# Patient Record
Sex: Male | Born: 1955
Health system: Southern US, Community
[De-identification: ages and names within clinical notes are randomized; demographics above are authoritative.]

## PROBLEM LIST (undated history)

## (undated) DIAGNOSIS — K5790 Diverticulosis of intestine, part unspecified, without perforation or abscess without bleeding: Secondary | ICD-10-CM

## (undated) DIAGNOSIS — E7489 Other specified disorders of carbohydrate metabolism: Secondary | ICD-10-CM

## (undated) DIAGNOSIS — F988 Other specified behavioral and emotional disorders with onset usually occurring in childhood and adolescence: Secondary | ICD-10-CM

## (undated) DIAGNOSIS — M5030 Other cervical disc degeneration, unspecified cervical region: Secondary | ICD-10-CM

## (undated) DIAGNOSIS — T8859XA Other complications of anesthesia, initial encounter: Secondary | ICD-10-CM

## (undated) DIAGNOSIS — M797 Fibromyalgia: Secondary | ICD-10-CM

## (undated) DIAGNOSIS — J189 Pneumonia, unspecified organism: Secondary | ICD-10-CM

## (undated) DIAGNOSIS — E748 Other specified disorders of carbohydrate metabolism: Secondary | ICD-10-CM

## (undated) HISTORY — PX: LUMBAR DISC SURGERY: SHX700

## (undated) HISTORY — DX: Diverticulosis of intestine, part unspecified, without perforation or abscess without bleeding: K57.90

## (undated) HISTORY — DX: Other specified disorders of carbohydrate metabolism: E74.89

## (undated) HISTORY — PX: LAMINECTOMY: SHX219

## (undated) HISTORY — DX: Other specified behavioral and emotional disorders with onset usually occurring in childhood and adolescence: F98.8

## (undated) HISTORY — PX: NASAL SINUS SURGERY: SHX719

## (undated) HISTORY — DX: Other cervical disc degeneration, unspecified cervical region: M50.30

## (undated) HISTORY — DX: Other specified disorders of carbohydrate metabolism: E74.8

## (undated) HISTORY — PX: OTHER SURGICAL HISTORY: SHX169

## (undated) HISTORY — DX: Fibromyalgia: M79.7

---

## 1997-09-10 ENCOUNTER — Emergency Department (HOSPITAL_COMMUNITY): Admission: EM | Admit: 1997-09-10 | Discharge: 1997-09-10 | Payer: Self-pay | Admitting: Emergency Medicine

## 1997-09-11 ENCOUNTER — Ambulatory Visit (HOSPITAL_COMMUNITY): Admission: RE | Admit: 1997-09-11 | Discharge: 1997-09-11 | Payer: Self-pay | Admitting: General Surgery

## 1999-01-21 ENCOUNTER — Encounter: Admission: RE | Admit: 1999-01-21 | Discharge: 1999-01-21 | Payer: Self-pay | Admitting: Specialist

## 1999-01-21 ENCOUNTER — Encounter: Payer: Self-pay | Admitting: Specialist

## 2002-04-06 DIAGNOSIS — M5030 Other cervical disc degeneration, unspecified cervical region: Secondary | ICD-10-CM

## 2002-04-06 HISTORY — DX: Other cervical disc degeneration, unspecified cervical region: M50.30

## 2002-04-14 ENCOUNTER — Encounter: Payer: Self-pay | Admitting: Family Medicine

## 2002-04-14 ENCOUNTER — Encounter: Admission: RE | Admit: 2002-04-14 | Discharge: 2002-04-14 | Payer: Self-pay | Admitting: Family Medicine

## 2005-11-01 ENCOUNTER — Ambulatory Visit: Payer: Self-pay | Admitting: Family Medicine

## 2005-12-27 ENCOUNTER — Encounter: Admission: RE | Admit: 2005-12-27 | Discharge: 2005-12-27 | Payer: Self-pay | Admitting: General Surgery

## 2007-01-29 ENCOUNTER — Ambulatory Visit: Payer: Self-pay | Admitting: Family Medicine

## 2007-02-20 LAB — HM COLONOSCOPY

## 2007-04-02 ENCOUNTER — Ambulatory Visit: Payer: Self-pay | Admitting: Family Medicine

## 2007-05-08 ENCOUNTER — Ambulatory Visit: Payer: Self-pay | Admitting: Family Medicine

## 2007-10-29 ENCOUNTER — Ambulatory Visit: Payer: Self-pay | Admitting: Family Medicine

## 2008-01-04 ENCOUNTER — Encounter: Admission: RE | Admit: 2008-01-04 | Discharge: 2008-01-04 | Payer: Self-pay | Admitting: Sports Medicine

## 2008-01-15 ENCOUNTER — Ambulatory Visit: Payer: Self-pay | Admitting: Family Medicine

## 2008-01-15 ENCOUNTER — Encounter: Admission: RE | Admit: 2008-01-15 | Discharge: 2008-01-15 | Payer: Self-pay | Admitting: Family Medicine

## 2008-03-19 ENCOUNTER — Ambulatory Visit (HOSPITAL_BASED_OUTPATIENT_CLINIC_OR_DEPARTMENT_OTHER): Admission: RE | Admit: 2008-03-19 | Discharge: 2008-03-19 | Payer: Self-pay | Admitting: Orthopedic Surgery

## 2008-04-15 ENCOUNTER — Ambulatory Visit: Payer: Self-pay | Admitting: Family Medicine

## 2008-06-16 ENCOUNTER — Ambulatory Visit: Payer: Self-pay | Admitting: Family Medicine

## 2008-10-27 ENCOUNTER — Ambulatory Visit: Payer: Self-pay | Admitting: Family Medicine

## 2009-05-28 ENCOUNTER — Ambulatory Visit: Payer: Self-pay | Admitting: Family Medicine

## 2009-07-15 ENCOUNTER — Ambulatory Visit: Payer: Self-pay | Admitting: Family Medicine

## 2009-12-07 ENCOUNTER — Ambulatory Visit: Payer: Self-pay | Admitting: Family Medicine

## 2010-02-16 ENCOUNTER — Ambulatory Visit: Payer: Self-pay | Admitting: Family Medicine

## 2010-02-24 ENCOUNTER — Ambulatory Visit: Payer: Self-pay | Admitting: Family Medicine

## 2010-03-27 ENCOUNTER — Encounter: Payer: Self-pay | Admitting: Sports Medicine

## 2010-04-12 ENCOUNTER — Ambulatory Visit: Payer: Self-pay | Admitting: Physician Assistant

## 2010-04-13 ENCOUNTER — Ambulatory Visit (INDEPENDENT_AMBULATORY_CARE_PROVIDER_SITE_OTHER): Payer: BC Managed Care – PPO | Admitting: Family Medicine

## 2010-04-13 DIAGNOSIS — M25519 Pain in unspecified shoulder: Secondary | ICD-10-CM

## 2010-04-13 DIAGNOSIS — J309 Allergic rhinitis, unspecified: Secondary | ICD-10-CM

## 2010-04-13 DIAGNOSIS — M76899 Other specified enthesopathies of unspecified lower limb, excluding foot: Secondary | ICD-10-CM

## 2010-06-02 ENCOUNTER — Ambulatory Visit (INDEPENDENT_AMBULATORY_CARE_PROVIDER_SITE_OTHER): Payer: BC Managed Care – PPO | Admitting: Family Medicine

## 2010-06-02 DIAGNOSIS — Z79899 Other long term (current) drug therapy: Secondary | ICD-10-CM

## 2010-06-02 DIAGNOSIS — B353 Tinea pedis: Secondary | ICD-10-CM

## 2010-06-02 DIAGNOSIS — F988 Other specified behavioral and emotional disorders with onset usually occurring in childhood and adolescence: Secondary | ICD-10-CM

## 2010-06-02 DIAGNOSIS — J209 Acute bronchitis, unspecified: Secondary | ICD-10-CM

## 2010-06-20 LAB — POCT HEMOGLOBIN-HEMACUE: Hemoglobin: 15.1 g/dL (ref 13.0–17.0)

## 2010-07-19 NOTE — Op Note (Signed)
NAMEDAVIS, AMBROSINI NO.:  [id number]   MEDICAL RECORD NO.:  [id number]          PATIENT TYPE:  AMB   LOCATION:  DSC                          FACILITY:  [facility]   PHYSICIAN:  Loreta Ave, M.D. DATE OF BIRTH:  November 13, 1955   DATE OF PROCEDURE:  03/19/2008  DATE OF DISCHARGE:                               OPERATIVE REPORT   PREOPERATIVE DIAGNOSIS:  Right shoulder impingement, distal clavicle  osteolysis and tendinopathy, rotator cuff.   POSTOPERATIVE DIAGNOSES:  Right shoulder impingement, distal clavicle  osteolysis and tendinopathy, rotator cuff with also complex tearing of  anterosuperior labrum.  Partial tearing, undersurface of the cuff.  No  full-thickness tears.   PROCEDURE:  Right shoulder exam under anesthesia, arthroscopy,  debridement of labrum and the rotator cuff.  Bursectomy, acromioplasty,  coracoacromial ligament release.  Excision, distal clavicle.   SURGEON:  Loreta Ave, MD   ANESTHESIA:  General.   BLOOD LOSS:  Minimal.   SPECIMENS:  None.   CULTURES:  None.   COMPLICATIONS:  None.   DRESSING:  Soft compressive with sling.   PROCEDURE:  The patient was brought to the operating room and placed on  the operating table in supine position.  After adequate anesthesia had  been obtained, shoulder examined.  Full motion stable shoulder.  Placed  in a beach-chair position on the shoulder positioner and prepped and  draped in the usual sterile fashion.  Three portals, one each in  anterior, posterior, and lateral.  Shoulder entered with blunt  obturator.  Arthroscope introduced into the shoulder distended and  inspected.  Complex tearing of superior labrum debrided.  Biceps tendon,  biceps anchor, and articular cartilage all otherwise looked intact.  A  little partial tearing in the crescent region of the cuff debrided.  Cable well anchored.  Infraspinatus looked good.  Cannula re-directed  subacromially.  Type II to III acromion,  marked reactive bursitis,  abrasive changes on the top of the cuff, supraspinatus anteriorly.  Bursa resected and cuff debrided.  Acromioplasty to a type 1 acromion  releasing CA ligament with cautery.  Distal clavicle grade 4 changes,  spurs, osteolysis, inflammatory debris.  Periarticular spurs, debris in  the joint and the lateral centimeter of clavicle resected.  Adequacy of decompression and clavicle excision confirmed viewing from  all portals.  Instruments and fluid removed.  Portals, shoulder, bursa  injected with Marcaine.  Portals were closed with 4-0 nylon.  Sterile  compressive dressing applied.  Anesthesia reversed.  Brought to the  recovery room.  Tolerated surgery well.  No complications.      Loreta Ave, M.D.  Electronically Signed     DFM/MEDQ  D:  03/19/2008  T:  03/20/2008  Job:  [id number]

## 2010-12-05 ENCOUNTER — Encounter: Payer: Self-pay | Admitting: Family Medicine

## 2010-12-05 ENCOUNTER — Ambulatory Visit (INDEPENDENT_AMBULATORY_CARE_PROVIDER_SITE_OTHER): Payer: BC Managed Care – PPO | Admitting: Medical

## 2010-12-05 ENCOUNTER — Encounter: Payer: Self-pay | Admitting: Medical

## 2010-12-05 ENCOUNTER — Telehealth: Payer: Self-pay | Admitting: Medical

## 2010-12-05 VITALS — BP 130/90 | HR 72 | Temp 98.2°F | Resp 16 | Ht 72.0 in | Wt 182.0 lb

## 2010-12-05 DIAGNOSIS — Z79899 Other long term (current) drug therapy: Secondary | ICD-10-CM

## 2010-12-05 DIAGNOSIS — R0989 Other specified symptoms and signs involving the circulatory and respiratory systems: Secondary | ICD-10-CM

## 2010-12-05 DIAGNOSIS — G4700 Insomnia, unspecified: Secondary | ICD-10-CM

## 2010-12-05 DIAGNOSIS — R0609 Other forms of dyspnea: Secondary | ICD-10-CM

## 2010-12-05 DIAGNOSIS — R030 Elevated blood-pressure reading, without diagnosis of hypertension: Secondary | ICD-10-CM

## 2010-12-05 DIAGNOSIS — F988 Other specified behavioral and emotional disorders with onset usually occurring in childhood and adolescence: Secondary | ICD-10-CM | POA: Insufficient documentation

## 2010-12-05 DIAGNOSIS — R011 Cardiac murmur, unspecified: Secondary | ICD-10-CM

## 2010-12-05 LAB — BASIC METABOLIC PANEL
Chloride: 102 mEq/L (ref 96–112)
Glucose, Bld: 86 mg/dL (ref 70–99)
Potassium: 4.3 mEq/L (ref 3.5–5.3)
Sodium: 139 mEq/L (ref 135–145)

## 2010-12-05 LAB — TSH: TSH: 1.803 u[IU]/mL (ref 0.350–4.500)

## 2010-12-05 LAB — CBC
Hemoglobin: 13.3 g/dL (ref 13.0–17.0)
MCHC: 32.4 g/dL (ref 30.0–36.0)
RBC: 4.49 MIL/uL (ref 4.22–5.81)
WBC: 5.3 10*3/uL (ref 4.0–10.5)

## 2010-12-05 MED ORDER — AMPHETAMINE-DEXTROAMPHET ER 30 MG PO CP24: 30.0000 mg | ORAL_CAPSULE | ORAL | Status: DC

## 2010-12-05 MED ORDER — ESZOPICLONE 2 MG PO TABS: 2.0000 mg | ORAL_TABLET | Freq: Every day | ORAL | Status: DC

## 2010-12-05 NOTE — Patient Instructions (Signed)
Follow up as planned for cardiology evaluation.

## 2010-12-05 NOTE — Progress Notes (Signed)
Subjective:   HPI  Nicholas [name] is a 55 y.o. male who presents for multiple c/o.  He notes trouble breathing.  He travels often, is in different climates regularly.  Last week he was in [place], few weeks ago was in [place].  He travels all the time.  He will be gone the next month as well.  He notes that he use to not have allergy problems until a few years ago was exposed to cotton dust in [place].  Since then, he has had allergy problems.    For the last few months he notes that he stays congested, and at times feels short of breath.  He notes that when he is active, such as running down a field, he gets difficulty breathing with exercise.  Can only go about 200 yards before being out of breath.  Usually doesn't have problems breathing at rest.  He denies hx/o heart or lung problems.  He is a lifetime nonsmoker.  He has no significant family hx/o heart disease.  He is using Zyrtec and Astepro nasal spray regularly but this hasn't helped his breathing. He notes hx/o 5 nasal surgeries, stays stopped up in nose all the time.   He notes sleeping problems.  Because of his travel schedule, living in hotel, sharing rooms with other coworkers, he rarely sleeps well.  He is a very light sleeper, says he can "hear an ant pissing on cotton".  He has trouble getting and staying asleep.   Lately he has been drinking several glasses of alcohol nightly just to get to sleep.   When he is sharing a hotel room, he seldom gets more than 3-4 hours of sleep due to the other person snoring, or due to there smoking or hygiene issues.  He has tried various things in the past for sleep - Ambien, Melatonin, Benadryl, and none help.  Benadryl makes him not sleep for days.    No other aggravating or relieving factors.  No other c/o.  The following portions of the patient's history were reviewed and updated as appropriate: allergies, current medications, past family history, past medical history, past  social history, past surgical history and problem list.  Past Medical History  Diagnosis Date  . DDD (degenerative disc disease), cervical 04/2002  . ADD (attention deficit disorder)   . Pancreatic alpha-amylase deficiency   . Fibromyalgia   . Hemorrhoids   . Diverticulosis    History reviewed. No pertinent past surgical history. No Known Allergies Current outpatient prescriptions:amphetamine-dextroamphetamine (ADDERALL XR) 30 MG 24 hr capsule, Take 30 mg by mouth every morning.  , Disp: , Rfl: ;  Multiple Vitamins-Minerals (MULTIVITAMIN WITH MINERALS) tablet, Take 1 tablet by mouth daily.  , Disp: , Rfl: ;  naproxen (NAPRELAN) 500 MG 24 hr tablet, Take 500 mg by mouth daily with breakfast.  , Disp: , Rfl:  amphetamine-dextroamphetamine (ADDERALL XR, 30MG ,) 30 MG 24 hr capsule, Take 1 capsule (30 mg total) by mouth every morning., Disp: 30 capsule, Rfl: 0;  eszopiclone (LUNESTA) 2 MG TABS, Take 1 tablet (2 mg total) by mouth at bedtime. Take immediately before bedtime, Disp: 7 tablet, Rfl: 1   Review of Systems Constitutional: denies fever, chills, sweats, unexpected weight change, anorexia, fatigue Allergy: +congestion, sneezing Dermatology: denies rash ENT: +runny nose; no ear pain, sore throat, hoarseness, sinus pain Cardiology: denies chest pain, palpitations, edema Respiratory: +DOE; denies cough, wheezing Gastroenterology: denies abdominal pain, nausea, vomiting, diarrhea, constipation Hematology: denies bleeding or bruising  problems Musculoskeletal: denies arthralgias, myalgias, joint swelling, back pain Urology: denies dysuria, difficulty urinating Neurology: no headache, weakness, tingling, numbness Psychology: sleep problem; denies depressed mood, agitation,      Objective:   Physical Exam  General appearance: alert, no distress, WD/WN, lean white male HEENT: normocephalic, sclerae anicteric, PERRLA, EOMi, nares with swollen turbinates, but no discharge or erythema,  pharynx normal Oral cavity: MMM, no lesions Neck: supple, no lymphadenopathy, no thyromegaly, no masses Heart: RRR, normal S1, S2, no murmurs Lungs: CTA bilaterally, no wheezes, rhonchi, or rales Abdomen: +bs, soft, non tender, non distended, no masses, no hepatomegaly, no splenomegaly Extremities: no edema, no cyanosis, no clubbing Pulses: 2+ symmetric, upper and lower extremities, normal cap refill Neurological: alert, oriented x 3, CN2-12 intact   Assessment :    Encounter Diagnoses  Name Primary?  Marland Kitchen Dyspnea on exertion Yes  . Heart murmur   . Elevated blood pressure reading without diagnosis of hypertension   . Encounter for long-term (current) use of other medications   . Insomnia   . ADD (attention deficit disorder)      Plan:   CXR today unremarkable, no acute changes.  We will send CXR for over read.   EKG today nsr, rate 75bpm, normal intervals, axis 20 degrees, risk factors male and age 10yo, normal EKG, no prior to compare.   Given his symptoms of DOE, elevated BP, and new heart murmur on exam today, I will refer to cardiology for evaluation to hopefully rule out cardiac source.   Labs today to help eval.  Insomnia - discussed sleep hygiene.  He has failed OTC Melatonin, has failed Ambien, and we will use trial of Lunesta 2mg  samples.  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   ADD - no c/t, doing well on Adderrall XR 30mg  daily.  Refilled medication today.

## 2010-12-06 NOTE — Telephone Encounter (Signed)
DONE

## 2010-12-08 ENCOUNTER — Telehealth: Payer: Self-pay | Admitting: Family Medicine

## 2010-12-12 ENCOUNTER — Other Ambulatory Visit: Payer: Self-pay | Admitting: Medical

## 2010-12-12 MED ORDER — TRAZODONE HCL 50 MG PO TABS: 50.0000 mg | ORAL_TABLET | Freq: Every day | ORAL | Status: AC

## 2010-12-21 NOTE — Telephone Encounter (Signed)
dt ?

## 2011-02-22 ENCOUNTER — Ambulatory Visit: Payer: BC Managed Care – PPO | Admitting: Family Medicine

## 2011-02-23 ENCOUNTER — Encounter: Payer: Self-pay | Admitting: Family Medicine

## 2011-02-23 ENCOUNTER — Ambulatory Visit (INDEPENDENT_AMBULATORY_CARE_PROVIDER_SITE_OTHER): Payer: BC Managed Care – PPO | Admitting: Family Medicine

## 2011-02-23 ENCOUNTER — Telehealth: Payer: Self-pay | Admitting: Family Medicine

## 2011-02-23 VITALS — BP 118/72 | HR 72 | Temp 98.3°F | Wt 184.0 lb

## 2011-02-23 DIAGNOSIS — J019 Acute sinusitis, unspecified: Secondary | ICD-10-CM

## 2011-02-23 MED ORDER — HYDROCOD POLST-CHLORPHEN POLST 10-8 MG/5ML PO LQCR: 5.0000 mL | Freq: Two times a day (BID) | ORAL | Status: DC | PRN

## 2011-02-23 MED ORDER — AMOXICILLIN 875 MG PO TABS: 875.0000 mg | ORAL_TABLET | Freq: Two times a day (BID) | ORAL | Status: AC

## 2011-02-23 NOTE — Telephone Encounter (Signed)
TSD  

## 2011-02-23 NOTE — Progress Notes (Signed)
Subjective:    Patient ID: Nicholas [name], male    DOB: 09/30/1955, 55 y.o.   MRN: [id number]  HPI He complains of a five-day history of cough, sinus congestion, left maxillary discomfort with upper tooth pain as well as PND. No fever or chills, sore throat or earache. He does not smoke and has no underlying allergies.   Review of Systems     Objective:   Physical Exam alert and in no distress. Tympanic membranes and canals are normal. Throat is clear. Tonsils are normal. Neck is supple without adenopathy or thyromegaly. Cardiac exam shows a regular sinus rhythm without murmurs or gallops. Lungs are clear to auscultation. Nasal mucosa is slightly red with some tenderness over her left maxillary sinus.       Assessment & Plan:   1. Sinusitis acute    I will treat with Amoxil. He is to call not entirely better.

## 2011-04-25 ENCOUNTER — Encounter: Payer: Self-pay | Admitting: Family Medicine

## 2011-04-25 ENCOUNTER — Ambulatory Visit (INDEPENDENT_AMBULATORY_CARE_PROVIDER_SITE_OTHER): Payer: BC Managed Care – PPO | Admitting: Family Medicine

## 2011-04-25 VITALS — BP 120/70 | HR 76 | Wt 187.0 lb

## 2011-04-25 DIAGNOSIS — J019 Acute sinusitis, unspecified: Secondary | ICD-10-CM

## 2011-04-25 DIAGNOSIS — R6882 Decreased libido: Secondary | ICD-10-CM

## 2011-04-25 DIAGNOSIS — F988 Other specified behavioral and emotional disorders with onset usually occurring in childhood and adolescence: Secondary | ICD-10-CM

## 2011-04-25 MED ORDER — AMPHETAMINE-DEXTROAMPHET ER 30 MG PO CP24: 30.0000 mg | ORAL_CAPSULE | ORAL | Status: DC

## 2011-04-25 MED ORDER — AMOXICILLIN-POT CLAVULANATE 875-125 MG PO TABS: 1.0000 | ORAL_TABLET | Freq: Two times a day (BID) | ORAL | Status: AC

## 2011-04-25 NOTE — Patient Instructions (Signed)
Take all the antibiotic and call me if he not totally back to normal

## 2011-04-25 NOTE — Progress Notes (Signed)
Subjective:    Patient ID: Nicholas [name], male    DOB: 04/01/1955, 56 y.o.   MRN: [id number]  HPI He states that since his last visit he has had intermittent difficulty with nasal congestion, malaise, postnasal drainage and coughing. The antibiotic did get him better but not over it entirely. He continues to do well on his Adderall. He uses this sparingly and not on any regular basis. He'll some complains of a several month history of decreased energy, stamina as well as libido.   Review of Systems     Objective:   Physical Exam alert and in no distress. Tympanic membranes and canals are normal. Throat is clear. Tonsils are normal. Neck is supple without adenopathy or thyromegaly. Cardiac exam shows a regular sinus rhythm without murmurs or gallops. Lungs are clear to auscultation. Nasal mucosa is red with slight tenderness over ethmoid sinuses        Assessment & Plan:   1. Libido, decreased  Testosterone  2. Sinusitis, acute    3. ADD (attention deficit disorder)     his Adderall will be renewed. We'll also switch him to Augmentin. He is to call if continued difficulty.

## 2011-05-05 ENCOUNTER — Telehealth: Payer: Self-pay

## 2011-05-05 ENCOUNTER — Other Ambulatory Visit: Payer: Self-pay

## 2011-05-05 DIAGNOSIS — R0602 Shortness of breath: Secondary | ICD-10-CM

## 2011-05-05 NOTE — Telephone Encounter (Signed)
Pt called today and asked if you would please call him back he is so tired and lazy and doesn't understand

## 2011-05-05 NOTE — Telephone Encounter (Signed)
Need PFT  

## 2011-05-05 NOTE — Telephone Encounter (Signed)
PT HAS APPT MARCH 12 @ 9 AM AT [facility]

## 2011-05-16 ENCOUNTER — Ambulatory Visit (HOSPITAL_COMMUNITY)
Admission: RE | Admit: 2011-05-16 | Discharge: 2011-05-16 | Disposition: A | Discharge status: Home / Self Care | Payer: BC Managed Care – PPO | Source: Ambulatory Visit | Attending: Family Medicine | Admitting: Family Medicine

## 2011-05-16 DIAGNOSIS — R0602 Shortness of breath: Secondary | ICD-10-CM

## 2011-05-16 LAB — PULMONARY FUNCTION TEST

## 2011-05-16 MED ORDER — ALBUTEROL SULFATE (5 MG/ML) 0.5% IN NEBU
2.5000 mg | INHALATION_SOLUTION | Freq: Once | RESPIRATORY_TRACT | Status: AC
  Administered 2011-05-16: 2.5 mg via RESPIRATORY_TRACT

## 2011-05-31 ENCOUNTER — Telehealth: Payer: Self-pay

## 2011-05-31 NOTE — Telephone Encounter (Signed)
Pt would like to get raspatory test results we sent him to have 3/12 at [facility] please advise

## 2011-05-31 NOTE — Telephone Encounter (Signed)
I do not see the final report. Have them fax me a copy. Let him know that they have not sent a report

## 2011-06-01 NOTE — Telephone Encounter (Signed)
Nicholas [name] please see if you can locate the report

## 2011-06-01 NOTE — Telephone Encounter (Signed)
Pt informed we are in process of getting his report

## 2011-06-01 NOTE — Telephone Encounter (Signed)
[facility] is faxing over results

## 2011-06-06 ENCOUNTER — Telehealth: Payer: Self-pay | Admitting: Internal Medicine

## 2011-06-06 MED ORDER — ALBUTEROL SULFATE HFA 108 (90 BASE) MCG/ACT IN AERS: 2.0000 | INHALATION_SPRAY | Freq: Four times a day (QID) | RESPIRATORY_TRACT | Status: DC | PRN

## 2011-06-06 NOTE — Telephone Encounter (Signed)
His pulmonary function testing came out normal. He still is having difficulty with coughing and states that it usually occurs every year around this time. In spite of the PFTs being normal with a bronchodilator, I will give him Proventil. He will call me tomorrow and I know how he is doing.

## 2011-06-07 ENCOUNTER — Telehealth: Payer: Self-pay | Admitting: Family Medicine

## 2011-06-07 NOTE — Telephone Encounter (Signed)
Schedule him an appointment with pulmonary. You can call in Tussionex but tell him it can make him drowsy and try to use it only at night

## 2011-06-08 ENCOUNTER — Other Ambulatory Visit: Payer: Self-pay

## 2011-06-08 MED ORDER — HYDROCODONE-HOMATROPINE 5-1.5 MG/5ML PO SYRP: 5.0000 mL | ORAL_SOLUTION | Freq: Four times a day (QID) | ORAL | Status: DC | PRN

## 2011-06-08 NOTE — Telephone Encounter (Signed)
Called cough med in per jcl 

## 2011-07-10 ENCOUNTER — Ambulatory Visit (INDEPENDENT_AMBULATORY_CARE_PROVIDER_SITE_OTHER): Payer: BC Managed Care – PPO | Admitting: Pulmonary Disease

## 2011-07-10 ENCOUNTER — Encounter: Payer: Self-pay | Admitting: Pulmonary Disease

## 2011-07-10 ENCOUNTER — Ambulatory Visit (INDEPENDENT_AMBULATORY_CARE_PROVIDER_SITE_OTHER)
Admission: RE | Admit: 2011-07-10 | Discharge: 2011-07-10 | Disposition: A | Discharge status: Home / Self Care | Payer: BC Managed Care – PPO | Source: Ambulatory Visit | Attending: Pulmonary Disease | Admitting: Pulmonary Disease

## 2011-07-10 VITALS — BP 136/84 | HR 76 | Temp 98.3°F | Ht 72.0 in | Wt 184.6 lb

## 2011-07-10 DIAGNOSIS — R059 Cough, unspecified: Secondary | ICD-10-CM

## 2011-07-10 DIAGNOSIS — R05 Cough: Secondary | ICD-10-CM

## 2011-07-10 DIAGNOSIS — R0609 Other forms of dyspnea: Secondary | ICD-10-CM | POA: Insufficient documentation

## 2011-07-10 DIAGNOSIS — R053 Chronic cough: Secondary | ICD-10-CM

## 2011-07-10 NOTE — Progress Notes (Signed)
Subjective:    Patient ID: Nicholas [name], male    DOB: [birth year], 56 y.o.   MRN: [id number]  HPI The patient is a 56 year old male who has been asked to see for chronic cough and some dyspnea on exertion.  He was in his usual state of health until approximate 7 months ago, when he began to notice at work that he was more winded with heavier exertional activities.  Around the same time he developed a dry and hacking cough, that at times would result in cough paroxysms.  He describes a classic globus sensation, but denies frequent throat clearing that he is aware of.  He does have some postnasal drip, but denies any reflux symptoms that are obvious to him.  He has a history of nasal surgery in the past for obstruction and sinus infections, has not had a lot of problems with this since that time.  He has no history of asthma, and recent full pulmonary function studies were within normal limits.  He has not had a recent chest x-ray.  The patient states that his dyspnea is only with heavy exertional activities such as running or carrying heavy loads.  His breathing does not limit his activities of daily living.  Of note, the patient states he had a stress test last year that was unremarkable.   Review of Systems  Constitutional: Negative for fever and unexpected weight change.  HENT: Positive for dental problem. Negative for ear pain, nosebleeds, congestion, sore throat, rhinorrhea, sneezing, trouble swallowing, postnasal drip and sinus pressure.   Eyes: Negative for redness and itching.  Respiratory: Positive for cough and shortness of breath. Negative for chest tightness and wheezing.   Cardiovascular: Negative for palpitations and leg swelling.  Gastrointestinal: Negative for nausea and vomiting.  Genitourinary: Negative for dysuria.  Musculoskeletal: Positive for joint swelling.  Skin: Negative for rash.  Neurological: Negative for headaches.  Hematological: Does not bruise/bleed easily.    Psychiatric/Behavioral: Negative for dysphoric mood. The patient is not nervous/anxious.        Objective:   Physical Exam Constitutional:  Well developed, no acute distress  HENT:  Nares patent without discharge  Oropharynx without exudate, palate and uvula are normal  Eyes:  Perrla, eomi, no scleral icterus  Neck:  No JVD, no TMG  Cardiovascular:  Normal rate, regular rhythm, no rubs or gallops.  No murmurs        Intact distal pulses  Pulmonary :  Normal breath sounds, no stridor or respiratory distress   No rales, rhonchi, or wheezing  Abdominal:  Soft, nondistended, bowel sounds present.  No tenderness noted.   Musculoskeletal:  No lower extremity edema noted.  Lymph Nodes:  No cervical lymphadenopathy noted  Skin:  No cyanosis noted  Neurologic:  Alert, appropriate, moves all 4 extremities without obvious deficit.         Assessment & Plan:

## 2011-07-10 NOTE — Assessment & Plan Note (Signed)
The patient's cough sounds much more upper airway in origin than lower.  His lungs were totally clear today, and his pulmonary function studies recently were normal.  I would like to initially treat this as an upper airway cough, including empiric treatment for postnasal drip and laryngopharyngeal reflux.  If he continues to have issues with his cough, would consider a methacholine challenge test to put the issue of asthma to rest.

## 2011-07-10 NOTE — Assessment & Plan Note (Signed)
The patient is describing dyspnea only with very heavy exertional activities.  This does not interfere with his normal life.  Again, his lung exam and pulmonary function studies are totally normal.  We'll check a chest x-ray today, and see how his breathing does if we can eliminate his cough.

## 2011-07-10 NOTE — Patient Instructions (Signed)
Take dexilant 60mg  one in am and pm for one week, then decrease to am only Take chlorpheniramine 8mg  at bedtime and lunch until next visit. Please call me in 3 weeks with update on how things are going. Will check cxr today, and will call you with results.

## 2011-08-04 ENCOUNTER — Telehealth: Payer: Self-pay | Admitting: Pulmonary Disease

## 2011-08-04 MED ORDER — FLUTICASONE PROPIONATE 50 MCG/ACT NA SUSP: 2.0000 | Freq: Every day | NASAL | Status: DC

## 2011-08-04 NOTE — Telephone Encounter (Signed)
Make sure he is staying on chlorpheniramine 8mg  at bedtime, and can take zyrtec 10mg  each am as well if allergy symptoms are bad.  Can call in flonase nasal spray 2 each nostril each am, with no fills. If he continues to have issues despite this, we can talk about referral to allergist. Also, make sure he is staying on PPI.

## 2011-08-04 NOTE — Telephone Encounter (Signed)
I spoke with the pt and he states he cannot take chlortabs 8mg  at night because he keeps him awake. He states he is taking 4 mg at lunch and 4 mg at bedtime.  He states he has tried zyrtec in the past and it did not work. He is ok to try flonase. Rx sent. Pt aware. Carron Curie, CMA

## 2011-08-04 NOTE — Telephone Encounter (Signed)
Spoke with pt. He is calling to give update on how his cough is doing since starting dexilant and chlorpheniramine. Pt states that the cough had completely resolved for approx 5 days, but now he is in [place] and was recently in [place] and the cotton is blooming there "looks like it's snowing and people are coughing up cotton balls", the cough is right back where it was. Therefore he feels that the cough is coming from allergies and wants to know if [facility] rec that he gets allergy testing done. Please advise, thanks

## 2011-08-15 ENCOUNTER — Other Ambulatory Visit: Payer: Self-pay | Admitting: Orthopedic Surgery

## 2011-08-15 ENCOUNTER — Telehealth: Payer: Self-pay | Admitting: Family Medicine

## 2011-08-15 DIAGNOSIS — M25519 Pain in unspecified shoulder: Secondary | ICD-10-CM

## 2011-08-15 MED ORDER — AMPHETAMINE-DEXTROAMPHET ER 30 MG PO CP24: 30.0000 mg | ORAL_CAPSULE | ORAL | Status: DC

## 2011-08-15 NOTE — Telephone Encounter (Signed)
Adderall renewed.

## 2011-08-16 ENCOUNTER — Ambulatory Visit
Admission: RE | Admit: 2011-08-16 | Discharge: 2011-08-16 | Disposition: A | Discharge status: Home / Self Care | Payer: BC Managed Care – PPO | Source: Ambulatory Visit | Attending: Orthopedic Surgery | Admitting: Orthopedic Surgery

## 2011-08-16 DIAGNOSIS — M25519 Pain in unspecified shoulder: Secondary | ICD-10-CM

## 2011-08-17 ENCOUNTER — Telehealth: Payer: Self-pay | Admitting: Family Medicine

## 2011-08-17 NOTE — Telephone Encounter (Signed)
LM

## 2011-10-19 ENCOUNTER — Telehealth: Payer: Self-pay | Admitting: Internal Medicine

## 2011-10-19 MED ORDER — AMPHETAMINE-DEXTROAMPHET ER 30 MG PO CP24: 30.0000 mg | ORAL_CAPSULE | ORAL | Status: DC

## 2011-10-19 NOTE — Telephone Encounter (Signed)
Adderall renewed.

## 2011-12-27 ENCOUNTER — Telehealth: Payer: Self-pay | Admitting: Family Medicine

## 2011-12-27 MED ORDER — AMPHETAMINE-DEXTROAMPHET ER 30 MG PO CP24: 30.0000 mg | ORAL_CAPSULE | ORAL | Status: DC

## 2011-12-27 NOTE — Telephone Encounter (Signed)
Adderall renewed.

## 2011-12-28 ENCOUNTER — Telehealth: Payer: Self-pay | Admitting: Family Medicine

## 2011-12-28 NOTE — Telephone Encounter (Signed)
LM

## 2012-01-05 ENCOUNTER — Encounter: Payer: Self-pay | Admitting: Family Medicine

## 2012-01-05 ENCOUNTER — Ambulatory Visit (INDEPENDENT_AMBULATORY_CARE_PROVIDER_SITE_OTHER): Payer: BC Managed Care – PPO | Admitting: Family Medicine

## 2012-01-05 VITALS — BP 130/88 | HR 60 | Wt 189.0 lb

## 2012-01-05 DIAGNOSIS — G252 Other specified forms of tremor: Secondary | ICD-10-CM

## 2012-01-05 DIAGNOSIS — G250 Essential tremor: Secondary | ICD-10-CM

## 2012-01-05 NOTE — Progress Notes (Signed)
Subjective:    Patient ID: Nicholas [name], male    DOB: Jul 26, 1955, 56 y.o.   MRN: [id number]  HPI He has a several year history of difficulty with right greater than left hand shaking. He notes that when he wakes up, he does not note as much difficulty but when he gets under stress and starts to use his hands, he has more difficulty. 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 He did cut back on use of Adderall but notes no change in the tremor.   Review of Systems     Objective:   Physical Exam Alert and in no distress. No tremor noted when his hand is Lang at site however when he lifts his hand up or does any kind of activity, the tremor gets worse especially on the right. Normal strength.       Assessment & Plan:   1. Intention tremor  Ambulatory referral to Neurology

## 2012-04-30 ENCOUNTER — Telehealth: Payer: Self-pay | Admitting: Internal Medicine

## 2012-05-01 ENCOUNTER — Ambulatory Visit (INDEPENDENT_AMBULATORY_CARE_PROVIDER_SITE_OTHER): Payer: BC Managed Care – PPO | Admitting: Family Medicine

## 2012-05-01 ENCOUNTER — Encounter: Payer: Self-pay | Admitting: Family Medicine

## 2012-05-01 VITALS — BP 130/76 | HR 80 | Wt 184.0 lb

## 2012-05-01 DIAGNOSIS — R059 Cough, unspecified: Secondary | ICD-10-CM

## 2012-05-01 DIAGNOSIS — F988 Other specified behavioral and emotional disorders with onset usually occurring in childhood and adolescence: Secondary | ICD-10-CM

## 2012-05-01 DIAGNOSIS — R05 Cough: Secondary | ICD-10-CM

## 2012-05-01 DIAGNOSIS — R053 Chronic cough: Secondary | ICD-10-CM

## 2012-05-01 MED ORDER — AMPHETAMINE-DEXTROAMPHET ER 30 MG PO CP24: 30.0000 mg | ORAL_CAPSULE | ORAL | Status: DC

## 2012-05-01 MED ORDER — FLUTICASONE PROPIONATE 50 MCG/ACT NA SUSP: 2.0000 | Freq: Every day | NASAL | Status: DC

## 2012-05-01 NOTE — Progress Notes (Signed)
Subjective:    Patient ID: Nicholas [name], male    DOB: 02-14-1956, 57 y.o.   MRN: [id number]  HPI Recheck. He continues to do well on Adderall and does use it on an as-needed basis when he needs to focus. He has no difficulty with medication and has no withdrawal symptoms. He continues to do a lot of traveling working for the [employer]. He was seen in the past for the chronic cough and apparently finds Flonase helps reduce these symptoms.   Review of Systems     Objective:   Physical Exam Alert and in no distress otherwise not examined       Assessment & Plan:  ADD (attention deficit disorder) - Plan: amphetamine-dextroamphetamine (ADDERALL XR) 30 MG 24 hr capsule, amphetamine-dextroamphetamine (ADDERALL XR) 30 MG 24 hr capsule, amphetamine-dextroamphetamine (ADDERALL XR) 30 MG 24 hr capsule  Chronic cough - Plan: fluticasone (FLONASE) 50 MCG/ACT nasal spray, DISCONTINUED: fluticasone (FLONASE) 50 MCG/ACT nasal spray

## 2012-05-02 NOTE — Telephone Encounter (Signed)
done

## 2012-10-01 ENCOUNTER — Telehealth: Payer: Self-pay | Admitting: Family Medicine

## 2012-10-01 DIAGNOSIS — F988 Other specified behavioral and emotional disorders with onset usually occurring in childhood and adolescence: Secondary | ICD-10-CM

## 2012-10-01 MED ORDER — AMPHETAMINE-DEXTROAMPHET ER 30 MG PO CP24: 30.0000 mg | ORAL_CAPSULE | ORAL | Status: DC

## 2012-10-01 NOTE — Telephone Encounter (Signed)
LM for patient that Adderall Rx's are ready to be picked up

## 2012-10-01 NOTE — Telephone Encounter (Signed)
Pt needs refill on adderall. Call when ready.

## 2013-01-22 ENCOUNTER — Encounter: Payer: Self-pay | Admitting: Family Medicine

## 2013-01-22 ENCOUNTER — Ambulatory Visit (INDEPENDENT_AMBULATORY_CARE_PROVIDER_SITE_OTHER): Payer: BC Managed Care – PPO | Admitting: Family Medicine

## 2013-01-22 VITALS — BP 118/78 | HR 88 | Wt 182.0 lb

## 2013-01-22 DIAGNOSIS — Z5189 Encounter for other specified aftercare: Secondary | ICD-10-CM

## 2013-01-22 DIAGNOSIS — F988 Other specified behavioral and emotional disorders with onset usually occurring in childhood and adolescence: Secondary | ICD-10-CM

## 2013-01-22 DIAGNOSIS — S0121XD Laceration without foreign body of nose, subsequent encounter: Secondary | ICD-10-CM

## 2013-01-22 MED ORDER — AMPHETAMINE-DEXTROAMPHET ER 30 MG PO CP24: 30.0000 mg | ORAL_CAPSULE | ORAL | Status: DC

## 2013-01-22 MED ORDER — AMPHETAMINE-DEXTROAMPHET ER 30 MG PO CP24
30.0000 mg | ORAL_CAPSULE | ORAL | Status: DC
Start: 2013-03-24 — End: 2013-06-25

## 2013-01-22 NOTE — Progress Notes (Signed)
Subjective:    Patient ID: Nicholas [name], male    DOB: 05/25/1955, 57 y.o.   MRN: [id number]  HPI He is here for suture removal. He sustained a laceration to the bridge of his nose 7 days ago. He also needs a refill on his Adderall. It works 8-10 hours per day. He continues to do well on it. He does not complain of any side effects.   Review of Systems     Objective:   Physical Exam Alert and in no distress. 5 sutures were removed from the lesion present on the bridge of the nose.      Assessment & Plan:  ADD (attention deficit disorder)  Nasal laceration, subsequent encounter  his Adderall was renewed.

## 2013-06-25 ENCOUNTER — Encounter: Payer: Self-pay | Admitting: Family Medicine

## 2013-06-25 ENCOUNTER — Ambulatory Visit (INDEPENDENT_AMBULATORY_CARE_PROVIDER_SITE_OTHER): Payer: BC Managed Care – PPO | Admitting: Family Medicine

## 2013-06-25 VITALS — BP 130/90 | HR 72 | Ht 71.0 in | Wt 180.0 lb

## 2013-06-25 DIAGNOSIS — M25569 Pain in unspecified knee: Secondary | ICD-10-CM

## 2013-06-25 DIAGNOSIS — M549 Dorsalgia, unspecified: Secondary | ICD-10-CM

## 2013-06-25 DIAGNOSIS — S90129A Contusion of unspecified lesser toe(s) without damage to nail, initial encounter: Secondary | ICD-10-CM

## 2013-06-25 DIAGNOSIS — S90212A Contusion of left great toe with damage to nail, initial encounter: Secondary | ICD-10-CM

## 2013-06-25 DIAGNOSIS — S8010XA Contusion of unspecified lower leg, initial encounter: Secondary | ICD-10-CM

## 2013-06-25 DIAGNOSIS — S8011XA Contusion of right lower leg, initial encounter: Secondary | ICD-10-CM

## 2013-06-25 DIAGNOSIS — M25562 Pain in left knee: Secondary | ICD-10-CM

## 2013-06-25 DIAGNOSIS — F988 Other specified behavioral and emotional disorders with onset usually occurring in childhood and adolescence: Secondary | ICD-10-CM

## 2013-06-25 DIAGNOSIS — M25561 Pain in right knee: Secondary | ICD-10-CM

## 2013-06-25 MED ORDER — AMPHETAMINE-DEXTROAMPHET ER 30 MG PO CP24: 30.0000 mg | ORAL_CAPSULE | ORAL | Status: DC

## 2013-06-25 MED ORDER — CELECOXIB 200 MG PO CAPS: 200.0000 mg | ORAL_CAPSULE | Freq: Two times a day (BID) | ORAL | Status: DC

## 2013-06-25 NOTE — Progress Notes (Signed)
Subjective:    Patient ID: Nicholas [name], male    DOB: November 18, 1955, 58 y.o.   MRN: [id number]  HPI He is here for evaluation of multiple issues. He injured his right leg several weeks ago causing swelling and discoloration. He still has a small slightly tender lesion over the midanterior shin area. He continues to have bilateral knee pain and does use Naprosyn however the variety that he is on is now too expensive. Also continues to have difficulty with back pain. He also wants a refill on his ADD medication. He has noted difficulty with discoloration of his left great toe. He also injured his right great toe.   Review of Systems     Objective:   Physical Exam Alert and in no distress. Knees not examined. Exam of his right shin does show a small hematoma that is tender to palpation. His approximately 2-3 cm in size.       Assessment & Plan:  Contusion of right lower leg  Knee pain, bilateral - Plan: celecoxib (CELEBREX) 200 MG capsule  Subungual hematoma of great toe of left foot  Back pain - Plan: celecoxib (CELEBREX) 200 MG capsule  ADD (attention deficit disorder) - Plan: amphetamine-dextroamphetamine (ADDERALL XR) 30 MG 24 hr capsule, amphetamine-dextroamphetamine (ADDERALL XR) 30 MG 24 hr capsule, amphetamine-dextroamphetamine (ADDERALL XR) 30 MG 24 hr capsule  reassured him that the contusion we'll slowly go away. I will switch him to Celebrex since he has had difficulty with mother and states causing GI distress. Reassured him that the toenail problem is nothing to be concerned about. Recommend referral to physical therapy for his back however his travel schedule is causing difficulty. He will let he know when he is back in town second scheduled for a good back rehabilitation program.

## 2013-07-01 ENCOUNTER — Telehealth: Payer: Self-pay | Admitting: Family Medicine

## 2013-07-01 NOTE — Telephone Encounter (Signed)
Left message for pt to call/ need additional information concerning prior auth/ celebrex.

## 2013-07-02 ENCOUNTER — Encounter: Payer: Self-pay | Admitting: Family Medicine

## 2013-07-02 ENCOUNTER — Telehealth: Payer: Self-pay | Admitting: Family Medicine

## 2013-07-02 NOTE — Telephone Encounter (Signed)
P.A. CELEBREX  Approved, left message for pt, faxed pharmacy

## 2013-07-07 ENCOUNTER — Telehealth: Payer: Self-pay | Admitting: Family Medicine

## 2013-07-07 MED ORDER — HYDROCOD POLST-CHLORPHEN POLST 10-8 MG/5ML PO LQCR: 5.0000 mL | Freq: Two times a day (BID) | ORAL | Status: DC | PRN

## 2013-07-07 NOTE — Telephone Encounter (Signed)
Left message for pt to come by and pick up rx

## 2013-07-07 NOTE — Telephone Encounter (Signed)
Tell him he has to come in and pick the prescription

## 2013-08-07 ENCOUNTER — Telehealth: Payer: Self-pay | Admitting: Family Medicine

## 2013-08-07 NOTE — Telephone Encounter (Signed)
Referral faxed to [facility] and they will contact pt to schedule appointment

## 2013-08-07 NOTE — Telephone Encounter (Signed)
Nicholas [name] called from [place].  He is requested physical therapy for stretches or something to relieve the back pain.  He states it is killing him.  He will be in town June 16 and 17 and would like the therapy those days.  Or send him to a pain clinic.  He will be in town next week and can be reached at [phone]

## 2013-08-07 NOTE — Telephone Encounter (Signed)
Set him up for physical therapy for the days that he mentioned and let him know

## 2013-10-03 ENCOUNTER — Telehealth: Payer: Self-pay | Admitting: Internal Medicine

## 2013-10-03 NOTE — Telephone Encounter (Signed)
Pt states that the celebrex is not helping any and would like to try something else. Pt is only in town Monday afternoon for limited time. [facility] send in [facility] to [facility] [address]

## 2013-10-06 MED ORDER — DICLOFENAC SODIUM 75 MG PO TBEC: 75.0000 mg | DELAYED_RELEASE_TABLET | Freq: Two times a day (BID) | ORAL | Status: DC

## 2013-10-06 NOTE — Telephone Encounter (Signed)
Resending

## 2013-11-07 ENCOUNTER — Telehealth: Payer: Self-pay | Admitting: Family Medicine

## 2013-11-07 NOTE — Telephone Encounter (Signed)
Pt having a lot of pain from bottom down legs to ankles. He is out of town working and has made an appt to see Dr Susann Givens when pt returns to town on 9/21. Pain is debilitating at times. He has tried several OTC meds such as Absorbine Jr and Naproxen. What other OTC med can pt try to give him some pain relief until he gets back in town for the appointment?

## 2013-11-07 NOTE — Telephone Encounter (Signed)
Without seeing him and examining, can try alternating tylenol and naprosyn every 6 hours

## 2013-11-12 NOTE — Telephone Encounter (Signed)
Left message for pt

## 2013-11-24 ENCOUNTER — Ambulatory Visit
Admission: RE | Admit: 2013-11-24 | Discharge: 2013-11-24 | Disposition: A | Discharge status: Home / Self Care | Payer: BC Managed Care – PPO | Source: Ambulatory Visit | Attending: Family Medicine | Admitting: Family Medicine

## 2013-11-24 ENCOUNTER — Encounter: Payer: Self-pay | Admitting: Family Medicine

## 2013-11-24 ENCOUNTER — Telehealth: Payer: Self-pay | Admitting: Family Medicine

## 2013-11-24 ENCOUNTER — Ambulatory Visit (INDEPENDENT_AMBULATORY_CARE_PROVIDER_SITE_OTHER): Payer: BC Managed Care – PPO | Admitting: Family Medicine

## 2013-11-24 VITALS — BP 126/80 | HR 57 | Wt 176.0 lb

## 2013-11-24 DIAGNOSIS — M79604 Pain in right leg: Secondary | ICD-10-CM

## 2013-11-24 DIAGNOSIS — F988 Other specified behavioral and emotional disorders with onset usually occurring in childhood and adolescence: Secondary | ICD-10-CM

## 2013-11-24 DIAGNOSIS — M79609 Pain in unspecified limb: Secondary | ICD-10-CM

## 2013-11-24 DIAGNOSIS — M79605 Pain in left leg: Principal | ICD-10-CM

## 2013-11-24 MED ORDER — AMPHETAMINE-DEXTROAMPHET ER 30 MG PO CP24
30.0000 mg | ORAL_CAPSULE | ORAL | Status: DC
Start: 2014-01-24 — End: 2014-03-03

## 2013-11-24 MED ORDER — AMPHETAMINE-DEXTROAMPHET ER 30 MG PO CP24: 30.0000 mg | ORAL_CAPSULE | ORAL | Status: DC

## 2013-11-24 NOTE — Telephone Encounter (Signed)
Pt called for results and adderall rx.  Please advise pt early as he leaves tomorrow noon [phone]
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
## 2013-11-25 ENCOUNTER — Other Ambulatory Visit: Payer: Self-pay

## 2013-11-25 ENCOUNTER — Other Ambulatory Visit: Payer: Self-pay | Admitting: Family Medicine

## 2013-11-25 DIAGNOSIS — M545 Low back pain, unspecified: Secondary | ICD-10-CM

## 2013-12-08 ENCOUNTER — Ambulatory Visit
Admission: RE | Admit: 2013-12-08 | Discharge: 2013-12-08 | Disposition: A | Discharge status: Home / Self Care | Payer: BC Managed Care – PPO | Source: Ambulatory Visit | Attending: Family Medicine | Admitting: Family Medicine

## 2013-12-08 DIAGNOSIS — M545 Low back pain, unspecified: Secondary | ICD-10-CM

## 2013-12-11 ENCOUNTER — Telehealth: Payer: Self-pay

## 2013-12-11 NOTE — Telephone Encounter (Signed)
PATIENT CALLED AND LEFT MESSAGE THAT HE COULDN'T MAKE HIS APPOINTMENT OCT12 AND AFTER THAT I COULDN'T UNDERSTAND HIS MESSAGE LEFT MESSAGE FOR HIM TO CALL ME BACK

## 2013-12-12 ENCOUNTER — Telehealth: Payer: Self-pay

## 2013-12-12 NOTE — Telephone Encounter (Signed)
I HAVE CALLED AND LEFT PT A MESSAGE THAT HE HAS A NEW APPOINTMENT Monday 12/15/13 AT 9:45 AM THEY WANTED ME TO SCHEDULE HIM FOR NOV 2 I EXPLAINED WHAT WAS GOING ON AND THEY GOT HIM BACK IN ON Monday JUST EARLIER

## 2014-03-03 ENCOUNTER — Telehealth: Payer: Self-pay | Admitting: Family Medicine

## 2014-03-03 DIAGNOSIS — F988 Other specified behavioral and emotional disorders with onset usually occurring in childhood and adolescence: Secondary | ICD-10-CM

## 2014-03-03 MED ORDER — AMPHETAMINE-DEXTROAMPHET ER 30 MG PO CP24: 30.0000 mg | ORAL_CAPSULE | ORAL | Status: DC

## 2014-03-03 NOTE — Telephone Encounter (Signed)
Adderall renewed.

## 2014-03-04 ENCOUNTER — Telehealth: Payer: Self-pay | Admitting: Family Medicine

## 2014-03-04 NOTE — Telephone Encounter (Signed)
Called pt and advised rx adderall ready to pick up

## 2014-03-10 ENCOUNTER — Other Ambulatory Visit: Payer: Self-pay | Admitting: Family Medicine

## 2014-03-10 MED ORDER — HYDROCODONE-ACETAMINOPHEN 5-325 MG PO TABS: 1.0000 | ORAL_TABLET | Freq: Four times a day (QID) | ORAL | Status: DC | PRN

## 2014-03-10 NOTE — Progress Notes (Signed)
He had stem cell injections in his knee and his back and given pain medications. He would like a refill on this.

## 2014-05-20 ENCOUNTER — Telehealth: Payer: Self-pay | Admitting: Family Medicine

## 2014-05-20 NOTE — Telephone Encounter (Signed)
Pt states going to [place] on Monday and just got an alert about the Zika Virus and wants to know if there is a shot, or any preventative measures he should do or take?

## 2014-06-19 ENCOUNTER — Telehealth: Payer: Self-pay | Admitting: Family Medicine

## 2014-06-19 DIAGNOSIS — F988 Other specified behavioral and emotional disorders with onset usually occurring in childhood and adolescence: Secondary | ICD-10-CM

## 2014-06-19 NOTE — Telephone Encounter (Addendum)
Requesting med for back pain. Would like Prednisone. Pt wanted an appointment for early Monday morning before he leaves to go out of town for work but no open appts. Also requesting Adderall 30mg  scripts. Wants written scripts for back meds as well since going out of town. Call pt at [phone] when scripts ready for pick up and if Dr Susann GivensLalonde can give med for back pain

## 2014-06-21 MED ORDER — AMPHETAMINE-DEXTROAMPHET ER 30 MG PO CP24: 30.0000 mg | ORAL_CAPSULE | ORAL | Status: DC

## 2014-06-21 NOTE — Telephone Encounter (Signed)
Have him come in early Monday.

## 2014-06-22 ENCOUNTER — Ambulatory Visit: Payer: Self-pay | Admitting: Family Medicine

## 2014-06-22 NOTE — Telephone Encounter (Signed)
Nicholas [name] has appointment

## 2014-07-06 ENCOUNTER — Ambulatory Visit (INDEPENDENT_AMBULATORY_CARE_PROVIDER_SITE_OTHER): Payer: BLUE CROSS/BLUE SHIELD | Admitting: Family Medicine

## 2014-07-06 VITALS — BP 130/90 | HR 76 | Wt 175.4 lb

## 2014-07-06 DIAGNOSIS — M545 Low back pain, unspecified: Secondary | ICD-10-CM

## 2014-07-06 MED ORDER — OXYCODONE-ACETAMINOPHEN 10-325 MG PO TABS: 1.0000 | ORAL_TABLET | Freq: Three times a day (TID) | ORAL | Status: DC | PRN

## 2014-07-06 NOTE — Progress Notes (Signed)
Subjective:    Patient ID: Nicholas [name], male    DOB: 1955-07-22, 59 y.o.   MRN: [id number]  HPI He is here for consult. He continues to have low back pain. He did have an MRI and has discussed care with his neurosurgeon. He recently had difficulty with this and was seen in an urgent care center. They did give him steroids and pain medication. She states that 2 days after being on steroids his pain was greatly improved however since he is no longer on it, the back pain has returned. He is concerned or proper care of this. He is interested in an dural injection.   Review of Systems     Objective:   Physical Exam  Alert and in no distress. The MRI was reviewed with him.      Assessment & Plan:  Midline low back pain without sciatica I will give him oxycodone to help for the interim. He will be working through the rest of the week. I strongly encouraged him to set up an appointment for epidural injection. Explained that I think this is very reasonable and the circumstances and avoiding surgery is prudent

## 2014-10-21 ENCOUNTER — Telehealth: Payer: Self-pay | Admitting: Family Medicine

## 2014-10-21 DIAGNOSIS — F988 Other specified behavioral and emotional disorders with onset usually occurring in childhood and adolescence: Secondary | ICD-10-CM

## 2014-10-21 NOTE — Telephone Encounter (Signed)
Pt needs refill Adderall, please call when ready  

## 2014-10-22 ENCOUNTER — Telehealth: Payer: Self-pay

## 2014-10-22 MED ORDER — AMPHETAMINE-DEXTROAMPHET ER 30 MG PO CP24: 30.0000 mg | ORAL_CAPSULE | ORAL | Status: DC

## 2014-10-22 NOTE — Telephone Encounter (Signed)
Left message for pt to come pick up adderall scripts.

## 2014-11-30 ENCOUNTER — Telehealth: Payer: Self-pay | Admitting: Family Medicine

## 2014-11-30 NOTE — Telephone Encounter (Signed)
okay

## 2014-11-30 NOTE — Telephone Encounter (Signed)
Pt called, and was wanting a referral to a back,spine specilist, surgeon  says the back pain is horrible, and something needs to be done. York Spaniel he has been going to the pain clinic for the past 10 months,and its not getting any better, wants to have surgery on it,pt can be reached at [phone]

## 2014-11-30 NOTE — Telephone Encounter (Signed)
Dr.Lalonde he want to go to someone who mininvasive microscopic or Indoscopic no lazar  Do you know anyone around here?

## 2014-12-01 NOTE — Telephone Encounter (Signed)
Send him to neurosurgery

## 2014-12-01 NOTE — Telephone Encounter (Signed)
PT INFORMED OF REFERRAL HE AGREED

## 2014-12-21 ENCOUNTER — Other Ambulatory Visit (HOSPITAL_COMMUNITY): Payer: Self-pay | Admitting: Neurosurgery

## 2015-01-12 ENCOUNTER — Ambulatory Visit (INDEPENDENT_AMBULATORY_CARE_PROVIDER_SITE_OTHER): Payer: BLUE CROSS/BLUE SHIELD | Admitting: Family Medicine

## 2015-01-12 ENCOUNTER — Encounter: Payer: Self-pay | Admitting: Family Medicine

## 2015-01-12 VITALS — BP 100/70 | HR 74 | Temp 98.4°F | Ht 72.0 in | Wt 171.0 lb

## 2015-01-12 DIAGNOSIS — M4806 Spinal stenosis, lumbar region: Secondary | ICD-10-CM

## 2015-01-12 DIAGNOSIS — J0100 Acute maxillary sinusitis, unspecified: Secondary | ICD-10-CM

## 2015-01-12 DIAGNOSIS — J040 Acute laryngitis: Secondary | ICD-10-CM | POA: Diagnosis not present

## 2015-01-12 DIAGNOSIS — F909 Attention-deficit hyperactivity disorder, unspecified type: Secondary | ICD-10-CM

## 2015-01-12 DIAGNOSIS — F988 Other specified behavioral and emotional disorders with onset usually occurring in childhood and adolescence: Secondary | ICD-10-CM

## 2015-01-12 DIAGNOSIS — M48061 Spinal stenosis, lumbar region without neurogenic claudication: Secondary | ICD-10-CM

## 2015-01-12 MED ORDER — AMPHETAMINE-DEXTROAMPHET ER 30 MG PO CP24: 30.0000 mg | ORAL_CAPSULE | ORAL | Status: DC

## 2015-01-12 MED ORDER — AMOXICILLIN-POT CLAVULANATE 875-125 MG PO TABS: 1.0000 | ORAL_TABLET | Freq: Two times a day (BID) | ORAL | Status: DC

## 2015-01-12 MED ORDER — HYDROCOD POLST-CPM POLST ER 10-8 MG/5ML PO SUER: 5.0000 mL | Freq: Two times a day (BID) | ORAL | Status: DC | PRN

## 2015-01-12 NOTE — Progress Notes (Signed)
Subjective:    Patient ID: Nicholas [name], male    DOB: 01/24/1956, 59 y.o.   MRN: [id number]  HPI He complains of a six-day history that started with hoarse voice followed by nasal congestion, PND, sinus congestion with headache. No fever, chills, earache and only slight sore throat. The cough has become productive. He does not smoke. He continues on his ADD medication and gets roughly 10 hours of benefit out of this. It helps him stay focused while he works. He also is scheduled for ask surgery. He was seen here in town and had a second opinion at [facility]. He will apparently had surgery done at [facility]. He complains of continued difficulty with back pain especially if he stands for any period of time.   Review of Systems     Objective:   Physical Exam Alert and in no distress. Tympanic membranes and canals are normal. Pharyngeal area is normal. Neck is supple without adenopathy or thyromegaly. Cardiac exam shows a regular sinus rhythm without murmurs or gallops. Lungs are clear to auscultation. Nasal mucosa is normal. No tenderness over sinuses        Assessment & Plan:  ADD (attention deficit disorder) - Plan: amphetamine-dextroamphetamine (ADDERALL XR) 30 MG 24 hr capsule, amphetamine-dextroamphetamine (ADDERALL XR) 30 MG 24 hr capsule, amphetamine-dextroamphetamine (ADDERALL XR) 30 MG 24 hr capsule  Laryngitis - Plan: amoxicillin-clavulanate (AUGMENTIN) 875-125 MG tablet, chlorpheniramine-HYDROcodone (TUSSIONEX PENNKINETIC ER) 10-8 MG/5ML SUER  Acute maxillary sinusitis, recurrence not specified - Plan: amoxicillin-clavulanate (AUGMENTIN) 875-125 MG tablet, chlorpheniramine-HYDROcodone (TUSSIONEX PENNKINETIC ER) 10-8 MG/5ML SUER  Spinal stenosis of lumbar region  his symptoms are most consistent with Laryngitis/sinusitis. I will treat this with Augmentin. He will let me know how this works. He is also given Tussionex to help with cough and his Adderall was renewed.

## 2015-01-15 ENCOUNTER — Telehealth: Payer: Self-pay | Admitting: Family Medicine

## 2015-01-15 MED ORDER — DOXYCYCLINE HYCLATE 100 MG PO TABS: 100.0000 mg | ORAL_TABLET | Freq: Two times a day (BID) | ORAL | Status: DC

## 2015-01-15 NOTE — Telephone Encounter (Signed)
Pt informed new med sent in

## 2015-01-15 NOTE — Telephone Encounter (Signed)
Pt states that Augmentin is causing bad diarrhea which is similar to Colonoscopy prep.This reaction starts shortly after taking an antibiotic pill. Pt requesting a different med.

## 2015-01-15 NOTE — Telephone Encounter (Signed)
Let him know that I called a different medicine in. 

## 2015-02-12 ENCOUNTER — Encounter (HOSPITAL_COMMUNITY): Admission: RE | Payer: Self-pay | Source: Ambulatory Visit

## 2015-02-12 ENCOUNTER — Inpatient Hospital Stay (HOSPITAL_COMMUNITY): Admission: RE | Admit: 2015-02-12 | Payer: Self-pay | Source: Ambulatory Visit | Admitting: Neurosurgery

## 2015-02-12 SURGERY — POSTERIOR LUMBAR FUSION 1 LEVEL
Anesthesia: General | Site: Back

## 2015-02-23 ENCOUNTER — Encounter: Payer: Self-pay | Admitting: Family Medicine

## 2015-03-11 ENCOUNTER — Ambulatory Visit (INDEPENDENT_AMBULATORY_CARE_PROVIDER_SITE_OTHER): Payer: BLUE CROSS/BLUE SHIELD | Admitting: Family Medicine

## 2015-03-11 ENCOUNTER — Encounter: Payer: Self-pay | Admitting: Family Medicine

## 2015-03-11 VITALS — BP 122/90 | HR 97 | Temp 99.1°F | Ht 72.0 in | Wt 168.0 lb

## 2015-03-11 DIAGNOSIS — R109 Unspecified abdominal pain: Secondary | ICD-10-CM

## 2015-03-11 DIAGNOSIS — R197 Diarrhea, unspecified: Secondary | ICD-10-CM | POA: Diagnosis not present

## 2015-03-11 LAB — POCT URINALYSIS DIPSTICK
Bilirubin, UA: NEGATIVE
GLUCOSE UA: NEGATIVE
Leukocytes, UA: NEGATIVE
Nitrite, UA: NEGATIVE
Protein, UA: 0.15
RBC UA: NEGATIVE
UROBILINOGEN UA: 2
pH, UA: 6

## 2015-03-12 NOTE — Progress Notes (Signed)
Subjective:    Patient ID: Nicholas [name], male    DOB: February 15, 1956, 60 y.o.   MRN: [id number]  HPI  he is here for evaluation of  Abdominal pain and diarrhea. He did have a back surgery on December 9. Postoperatively he was note to have swelling of the left knee. The effusion was drained and did show evidence of white blood cells. He was placed on antibiotics however the cultures came back negative. Postoperatively he did fairly well but then developed loose stools especially after he ate. He then also developed lower abdominal discomfort. Of note is that he also had a urinary catheter placed. He did have difficulty and had to be recathed at least on one occasion. Review of Systems     Objective:   Physical Exam Alert and in no distress. Abdominal exam shows active bowel sounds. He is tender to palpation over the suprapubic area but no rebound. Penis and testes normal. Urine dipstick was negative.       Assessment & Plan:  Diarrhea, unspecified type - Plan: CANCELED: C difficile Toxins A+B W/Rflx  Abdominal pain, unspecified abdominal location - Plan: Urinalysis Dipstick  I will wait on the C. difficile toxin. May also need to consider diverticulitis. Does not appear to be bladder related.

## 2015-03-15 ENCOUNTER — Telehealth: Payer: Self-pay

## 2015-03-15 NOTE — Telephone Encounter (Signed)
Pt would like a referral for [place] spine rehab in [place]

## 2015-03-15 NOTE — Telephone Encounter (Signed)
Set this up. His diagnosis is recent back surgery

## 2015-03-15 NOTE — Telephone Encounter (Signed)
Info sent for referral

## 2015-03-16 NOTE — Telephone Encounter (Signed)
[facility] on 03/15/15. Waiting for response on appt, pt came by and made him aware of us sending him

## 2015-03-17 ENCOUNTER — Encounter: Payer: Self-pay | Admitting: Family Medicine

## 2015-03-17 NOTE — Progress Notes (Signed)
Subjective:    Patient ID: Nicholas [name], male    DOB: [birth year], 60 y.o.   MRN: [id number]  HPI    Review of Systems     Objective:   Physical Exam        Assessment & Plan:  The stool evaluation came back entirely negative. He states that he is having only minimal abdominal discomfort and therefore we will not pursue any further evaluation.

## 2015-03-25 ENCOUNTER — Encounter: Payer: Self-pay | Admitting: Family Medicine

## 2015-05-11 ENCOUNTER — Encounter: Payer: Self-pay | Admitting: Family Medicine

## 2015-05-11 ENCOUNTER — Ambulatory Visit (INDEPENDENT_AMBULATORY_CARE_PROVIDER_SITE_OTHER): Payer: BLUE CROSS/BLUE SHIELD | Admitting: Family Medicine

## 2015-05-11 VITALS — BP 124/88 | HR 83 | Wt 175.0 lb

## 2015-05-11 DIAGNOSIS — J209 Acute bronchitis, unspecified: Secondary | ICD-10-CM

## 2015-05-11 MED ORDER — AZITHROMYCIN 500 MG PO TABS: 500.0000 mg | ORAL_TABLET | Freq: Every day | ORAL | Status: DC

## 2015-05-11 NOTE — Patient Instructions (Signed)
Call me in 10 days if not totally back to normal

## 2015-05-11 NOTE — Progress Notes (Signed)
Subjective:    Patient ID: Nicholas [name], male    DOB: [birth year], 60 y.o.   MRN: [id number]  HPI In mid February he developed cough and congestion that did slowly get better. Within the last 10 days the nasal congestion, rhinorrhea, productive cough have gotten worse. He has had no sore throat, earache, fever, chills. He does not smoke.   Review of Systems     Objective:   Physical Exam Alert and in no distress. Tympanic membranes and canals are normal. Pharyngeal area is normal. Neck is supple without adenopathy or thyromegaly. Cardiac exam shows a regular sinus rhythm without murmurs or gallops. Lungs are clear to auscultation.        Assessment & Plan:  Acute bronchitis, unspecified organism - Plan: azithromycin (ZITHROMAX) 500 MG tablet I think he had a recurrence of his bronchitis. I will give him azithromycin. He is to call after 10 days if not totally back to normal.

## 2015-06-01 ENCOUNTER — Ambulatory Visit (INDEPENDENT_AMBULATORY_CARE_PROVIDER_SITE_OTHER): Payer: BLUE CROSS/BLUE SHIELD | Admitting: Family Medicine

## 2015-06-01 ENCOUNTER — Ambulatory Visit (HOSPITAL_COMMUNITY)
Admission: RE | Admit: 2015-06-01 | Discharge: 2015-06-01 | Disposition: A | Discharge status: Home / Self Care | Payer: BLUE CROSS/BLUE SHIELD | Source: Ambulatory Visit | Attending: Family Medicine | Admitting: Family Medicine

## 2015-06-01 ENCOUNTER — Encounter: Payer: Self-pay | Admitting: Family Medicine

## 2015-06-01 VITALS — BP 138/82 | HR 64 | Temp 97.9°F | Wt 178.2 lb

## 2015-06-01 DIAGNOSIS — R059 Cough, unspecified: Secondary | ICD-10-CM

## 2015-06-01 DIAGNOSIS — R600 Localized edema: Secondary | ICD-10-CM

## 2015-06-01 DIAGNOSIS — R05 Cough: Secondary | ICD-10-CM

## 2015-06-01 DIAGNOSIS — R52 Pain, unspecified: Secondary | ICD-10-CM | POA: Insufficient documentation

## 2015-06-01 MED ORDER — AZITHROMYCIN 250 MG PO TABS: ORAL_TABLET | ORAL | Status: DC

## 2015-06-01 NOTE — Patient Instructions (Addendum)
Get compression stockings up to the knee.  Elevated legs when sitting.  If you are not 100% back to normal after day 10 of starting the antibiotic call and let us know.

## 2015-06-01 NOTE — Progress Notes (Signed)
Subjective:    Patient ID: Nicholas [name], male    DOB: 1955-05-02, 60 y.o.   MRN: [id number]  HPI Chief Complaint  Patient presents with  . issues with legs    spots on lower legs, swelling, no pain, noticed last tuesday. swelling and then redness friday. bronchitis and not any better. did great on azithromycin.    He is here with complaints of cough productive of clear and some greenish sputum, post nasal drainage and stuffy nose since March 15. States he took 3 days of Azithromycin and finished it on 05/13/2015, he took this for similar symptoms. States he got better but then symptoms returned after he traveled to [place] and [place] recently for work. He states symptoms are actually worse now. Reports history of seasonal allergies and thinks his allergies flared up while in [place]. He has Flonase but has not been using it.     He does not smoke.  He has history of bronchitis.  Denies fever, chills, nausea, vomiting, chest pain, shortness of breath, GI or GU symptoms.   He also complains of swelling to bilateral lower extremities since approximately 1 week ago and then he developed red dots on lower legs 4-5 days ago. States he has a prickly sensation when wearing socks or long pants but otherwise no pain.  He does reports significant airplane travel in the past few weeks. Denies history of LE edema. Denies history of blood clots or blood disorder. Denies history of arthralgias or myalgias.  States feet are normal, no swelling or pain. Denies numbness, tingling or weakness to LE.     Review of Systems Pertinent positives and negatives in the history of present illness.     Objective:   Physical Exam BP 138/82 mmHg  Pulse 64  Temp(Src) 97.9 F (36.6 C) (Oral)  Wt 178 lb 3.2 oz (80.831 kg)  Alert and in no distress. No sinus tenderness. Tympanic membranes and canals are normal. Pharyngeal area is normal. Neck is supple without adenopathy or thyromegaly. Cardiac exam shows a  regular sinus rhythm without murmurs or gallops. Lungs are clear to auscultation. Extremity exam showed bilateral 1+  LE edema from ankles to mid shin with multiple diffuse red pinpoint spots noted bilaterally to same, no petechia, surrounding erythema or signs of infection. Calves are equal in size and non tender. Negative homan's sign. Bilateral foot exam normal, with normal pulses, sensation, ROM and strength.       Assessment & Plan:  Bilateral leg edema - Plan: LE VENOUS, CANCELED: Ultrasound doppler venous legs bilat  Cough  Suspect recurrent URI infection causing cough, and seasonal allergies are contributing to his worsening symptoms. Will treat with a Z-pak and recommend that he also treat his allergies. He will call if not back to baseline 10 days after starting antibiotic. Recommend staying well hydrated.  Will send for bilateral Venous dopplers to rule out DVT due to edema and recent airplane travel although this is not high on my list of differentials. He has plans to travel again in 2 days for work. Will try to get [place] asap. Suspect rash is related to edema and not cellulitis or vasculitis. Recommend compression stockings and elevating his legs when sitting.  Dr Susann Givens also examined this patient and in agreement with plan of care.

## 2015-06-01 NOTE — Progress Notes (Signed)
*  Preliminary Results* Bilateral lower extremity venous duplex completed. Bilateral lower extremities are negative for deep vein thrombosis. There is evidence of two left Baker's cysts. No right Baker's cyst.  06/01/2015  Gertie FeyMichelle Obert Espindola, RVT, RDCS, RDMS

## 2015-07-16 ENCOUNTER — Telehealth: Payer: Self-pay | Admitting: Family Medicine

## 2015-07-16 ENCOUNTER — Other Ambulatory Visit: Payer: Self-pay | Admitting: Medical

## 2015-07-16 DIAGNOSIS — F988 Other specified behavioral and emotional disorders with onset usually occurring in childhood and adolescence: Secondary | ICD-10-CM

## 2015-07-16 MED ORDER — AMPHETAMINE-DEXTROAMPHET ER 30 MG PO CP24: 30.0000 mg | ORAL_CAPSULE | ORAL | Status: DC

## 2015-07-16 NOTE — Telephone Encounter (Signed)
Pt called for refills of Adderall XR 30mg . Pt was informed that JCL out of office and he states he is going out of town for 3 weeks and needs today. Pt can be reached at [phone]. Sending back to [place] to handle.

## 2015-07-16 NOTE — Telephone Encounter (Signed)
Pt advised rx is ready 

## 2015-07-16 NOTE — Telephone Encounter (Signed)
rx ready 

## 2015-08-18 ENCOUNTER — Telehealth: Payer: Self-pay

## 2015-08-18 DIAGNOSIS — F988 Other specified behavioral and emotional disorders with onset usually occurring in childhood and adolescence: Secondary | ICD-10-CM

## 2015-08-18 MED ORDER — AMPHETAMINE-DEXTROAMPHET ER 30 MG PO CP24: 30.0000 mg | ORAL_CAPSULE | ORAL | Status: DC

## 2015-08-18 NOTE — Telephone Encounter (Signed)
Pt called the office requesting refill of Adderall for 3 months worth if possible. Thank you, RLB

## 2015-08-19 ENCOUNTER — Telehealth: Payer: Self-pay | Admitting: Internal Medicine

## 2015-08-19 NOTE — Telephone Encounter (Signed)
Pt notified rx are ready to pick up

## 2015-12-07 ENCOUNTER — Ambulatory Visit (INDEPENDENT_AMBULATORY_CARE_PROVIDER_SITE_OTHER): Payer: BLUE CROSS/BLUE SHIELD | Admitting: Family Medicine

## 2015-12-07 ENCOUNTER — Encounter: Payer: Self-pay | Admitting: Family Medicine

## 2015-12-07 VITALS — BP 112/70 | HR 60 | Wt 172.0 lb

## 2015-12-07 DIAGNOSIS — M170 Bilateral primary osteoarthritis of knee: Secondary | ICD-10-CM | POA: Insufficient documentation

## 2015-12-07 DIAGNOSIS — M1990 Unspecified osteoarthritis, unspecified site: Secondary | ICD-10-CM | POA: Diagnosis not present

## 2015-12-07 DIAGNOSIS — M545 Low back pain, unspecified: Secondary | ICD-10-CM

## 2015-12-07 DIAGNOSIS — F988 Other specified behavioral and emotional disorders with onset usually occurring in childhood and adolescence: Secondary | ICD-10-CM

## 2015-12-07 DIAGNOSIS — G8929 Other chronic pain: Secondary | ICD-10-CM

## 2015-12-07 DIAGNOSIS — F900 Attention-deficit hyperactivity disorder, predominantly inattentive type: Secondary | ICD-10-CM | POA: Diagnosis not present

## 2015-12-07 MED ORDER — LISDEXAMFETAMINE DIMESYLATE 60 MG PO CAPS: 60.0000 mg | ORAL_CAPSULE | ORAL | 0 refills | Status: DC

## 2015-12-07 MED ORDER — DICLOFENAC SODIUM 75 MG PO TBEC: 75.0000 mg | DELAYED_RELEASE_TABLET | Freq: Two times a day (BID) | ORAL | 0 refills | Status: DC

## 2015-12-07 NOTE — Progress Notes (Signed)
Subjective:    Patient ID: Nicholas [name], male    DOB: Mar 16, 1955, 60 y.o.   MRN: [id number]  HPI He is here for medication check. He continues to have chronic back pain in spite of surgery. The surgery did help tremendously but he still is having some minor low back pain. NSAIDs specifically Aleve caused GI rotation with diarrhea. He states that Advil does not help much. He continues on Adderall and states that it lasts roughly 9 hours and helps him focus however he sometimes needs to stay focused for as much is 15 or 16 hours with his job working Tour manager. He also continues to have bilateral knee pain and has had recent injections to see if this would help.   Review of Systems     Objective:   Physical Exam Alert and in no distress otherwise not examined       Assessment & Plan:  Attention deficit disorder, unspecified hyperactivity presence - Plan: lisdexamfetamine (VYVANSE) 60 MG capsule  Arthritis - Plan: diclofenac (VOLTAREN) 75 MG EC tablet  Chronic midline low back pain without sciatica - Plan: diclofenac (VOLTAREN) 75 MG EC tablet We discussed options concerning the ADD and the arthritis. I will give him Vyvanse to see if this gives him a longer term coverage. I will also have him try Voltaren for his knee and back pain he will keep me informed concerning this.

## 2015-12-18 ENCOUNTER — Other Ambulatory Visit: Payer: Self-pay | Admitting: Family Medicine

## 2015-12-18 DIAGNOSIS — G8929 Other chronic pain: Secondary | ICD-10-CM

## 2015-12-18 DIAGNOSIS — M545 Low back pain, unspecified: Secondary | ICD-10-CM

## 2015-12-18 DIAGNOSIS — M1990 Unspecified osteoarthritis, unspecified site: Secondary | ICD-10-CM

## 2015-12-20 NOTE — Telephone Encounter (Signed)
Is this okay to refill? 

## 2016-01-18 ENCOUNTER — Other Ambulatory Visit: Payer: Self-pay | Admitting: Family Medicine

## 2016-01-18 ENCOUNTER — Telehealth: Payer: Self-pay

## 2016-01-18 ENCOUNTER — Telehealth: Payer: Self-pay | Admitting: Family Medicine

## 2016-01-18 DIAGNOSIS — M1990 Unspecified osteoarthritis, unspecified site: Secondary | ICD-10-CM

## 2016-01-18 DIAGNOSIS — G8929 Other chronic pain: Secondary | ICD-10-CM

## 2016-01-18 DIAGNOSIS — F909 Attention-deficit hyperactivity disorder, unspecified type: Secondary | ICD-10-CM

## 2016-01-18 DIAGNOSIS — F988 Other specified behavioral and emotional disorders with onset usually occurring in childhood and adolescence: Secondary | ICD-10-CM

## 2016-01-18 DIAGNOSIS — M545 Low back pain: Secondary | ICD-10-CM

## 2016-01-18 MED ORDER — LISDEXAMFETAMINE DIMESYLATE 70 MG PO CAPS: 70.0000 mg | ORAL_CAPSULE | Freq: Every day | ORAL | 0 refills | Status: DC

## 2016-01-18 NOTE — Telephone Encounter (Signed)
Pt called and stated that at his last OV he was changed to Vyvanse. He states it is not working as well as he would like. He states that his concentration level is just not as good as the Adderall. He stated that maybe the dose needs to be increased. Please advise. Pt can be reached at [phone].

## 2016-01-18 NOTE — Telephone Encounter (Signed)
Pt notified that Vyvanse script is ready to pick up. Trixie Rude/RLB

## 2016-01-19 NOTE — Telephone Encounter (Signed)
Is this okay to refill? 

## 2016-02-09 ENCOUNTER — Other Ambulatory Visit: Payer: Self-pay

## 2016-02-09 ENCOUNTER — Telehealth: Payer: Self-pay | Admitting: Family Medicine

## 2016-02-09 DIAGNOSIS — G629 Polyneuropathy, unspecified: Secondary | ICD-10-CM

## 2016-02-09 NOTE — Telephone Encounter (Signed)
Order has been put in epic

## 2016-02-09 NOTE — Telephone Encounter (Signed)
Set him up to see Nicholas PlummerFred [name] for evaluation of postoperative neuropathy

## 2016-02-09 NOTE — Telephone Encounter (Signed)
Pt came in and requested a referral to neuro to discuss possible nerve damage. He stated he saw Dr. Rosanne AshingJim Love years ago and he has retired. Please advise pt at [phone].

## 2016-02-18 ENCOUNTER — Other Ambulatory Visit: Payer: Self-pay | Admitting: Family Medicine

## 2016-02-18 DIAGNOSIS — G8929 Other chronic pain: Secondary | ICD-10-CM

## 2016-02-18 DIAGNOSIS — M1990 Unspecified osteoarthritis, unspecified site: Secondary | ICD-10-CM

## 2016-02-18 DIAGNOSIS — M545 Low back pain: Secondary | ICD-10-CM

## 2016-02-18 NOTE — Telephone Encounter (Signed)
Is this okay to refill? 

## 2016-03-08 ENCOUNTER — Ambulatory Visit (INDEPENDENT_AMBULATORY_CARE_PROVIDER_SITE_OTHER): Payer: Managed Care, Other (non HMO) | Admitting: Family Medicine

## 2016-03-08 VITALS — BP 130/80 | HR 72 | Resp 18

## 2016-03-08 DIAGNOSIS — F988 Other specified behavioral and emotional disorders with onset usually occurring in childhood and adolescence: Secondary | ICD-10-CM

## 2016-03-08 DIAGNOSIS — Z4802 Encounter for removal of sutures: Secondary | ICD-10-CM | POA: Diagnosis not present

## 2016-03-08 MED ORDER — AMPHETAMINE-DEXTROAMPHET ER 30 MG PO CP24: 30.0000 mg | ORAL_CAPSULE | ORAL | 0 refills | Status: DC

## 2016-03-08 MED ORDER — AMPHETAMINE-DEXTROAMPHET ER 30 MG PO CP24: 30.0000 mg | ORAL_CAPSULE | Freq: Every day | ORAL | 0 refills | Status: DC

## 2016-03-08 NOTE — Progress Notes (Signed)
Subjective:    Patient ID: Nicholas [name], male    DOB: 1955-04-29, 61 y.o.   MRN: [id number]  HPI He is here for suture removal and also to discuss ADD medication. He sustained a laceration to his face 5 days ago and is here for suture removal. He states that the Vyvanse that he was on did not work as well as the Adderall. He would like to be switched back to it.  Review of Systems     Objective:   Physical Exam Alert and in no distress. He has a 2 cm lesion on his forehead and one on the bridge of his nose with sutures. Healing quite nicely.       Assessment & Plan:  Visit for suture removal  Attention deficit disorder, unspecified hyperactivity presence - Plan: amphetamine-dextroamphetamine (ADDERALL XR) 30 MG 24 hr capsule, amphetamine-dextroamphetamine (ADDERALL XR) 30 MG 24 hr capsule, amphetamine-dextroamphetamine (ADDERALL XR) 30 MG 24 hr capsule Sutures were removed without difficulty. I will renew his Adderall at his previous dosing regimen.

## 2016-03-10 ENCOUNTER — Ambulatory Visit (INDEPENDENT_AMBULATORY_CARE_PROVIDER_SITE_OTHER): Payer: Managed Care, Other (non HMO) | Admitting: Orthopaedic Surgery

## 2016-03-10 ENCOUNTER — Encounter (INDEPENDENT_AMBULATORY_CARE_PROVIDER_SITE_OTHER): Payer: Self-pay | Admitting: Orthopaedic Surgery

## 2016-03-10 ENCOUNTER — Ambulatory Visit (INDEPENDENT_AMBULATORY_CARE_PROVIDER_SITE_OTHER): Payer: Self-pay

## 2016-03-10 VITALS — BP 141/92 | HR 73 | Ht 72.0 in | Wt 184.0 lb

## 2016-03-10 DIAGNOSIS — M5442 Lumbago with sciatica, left side: Secondary | ICD-10-CM

## 2016-03-10 DIAGNOSIS — G8929 Other chronic pain: Secondary | ICD-10-CM | POA: Diagnosis not present

## 2016-03-10 NOTE — Progress Notes (Signed)
Office Visit Note/aConsultation    Patient: Nicholas [name]           Date of Birth: Jul 11, 1955           MRN: [id number] Visit Date: 03/10/2016              Requested by: Nicholas Nian, MD [address] [place], [place] [address] PCP: Nicholas Herter, MD   Assessment & Plan: Visit Diagnoses:  1. Chronic left-sided low back pain with left-sided sciatica     Plan: We'll obtain a lumbar MRI with and without contrast post the L3-4 L4-5 decompression surgery with foraminotomy with persistent left calf weakness. After scan for review. It does not have any evidence of residual compression on MRI then we may need to consider EMGs nerve conduction velocities. We spent time reviewing his MRI scans from 2015, 2016 also plain radiographs taken today that shows a disc degeneration and previous laminectomy. Pathophysiology discussed. Questions about the potential options were discussed. Is been seen at [facility] he saw Nicholas [name] is well before his surgery and the has residual weakness after surgical procedure.  Thank you for the opportunity to see him in consultation Follow-Up Instructions: No Follow-up on file.   Orders:  Orders Placed This Encounter  Procedures  . XR Lumbar Spine 2-3 Views   No orders of the defined types were placed in this encounter.     Procedures: No procedures performed   Clinical Data: No additional findings.   Subjective: No chief complaint on file.   Nicholas [name] is here for low back back and for the left leg pain/weakness.  He states that he had surgery at [facility] 02/2015--Lumbar Stenosis repair at L3-4 laminectomy with Left L3-4 discectomy and L3-L5 bilateral foraminotomy.  He says he has some back pain, but is concerned with the left leg weakness.  On his right foot he is able to lift up and on the left foot he cannot push up.  Patient   year was taking the high doses of narcotic medication for his chronic pain.  He is now not on any  narcotic medication but has persistent left leg weakness is not able to golf and has tried the 2 different therapy facilities to try to improve his Strength. He cannot toe walk. He has back pain pain that radiates into his buttocks. Patient's had persistent left calf atrophy and weakness. No weakness of his right calf. Surgery was done by Nicholas [name] at [facility]..  Review of Systems  Constitutional: Negative for chills and diaphoresis.  HENT: Negative for ear discharge, ear pain and nosebleeds.   Eyes: Negative for discharge and visual disturbance.  Respiratory: Negative for cough, choking and shortness of breath.   Cardiovascular: Negative for chest pain and palpitations.  Gastrointestinal: Negative for abdominal distention and abdominal pain.  Endocrine: Negative for cold intolerance and heat intolerance.  Genitourinary: Negative for flank pain and hematuria.  Musculoskeletal: Positive for back pain and gait problem.  Skin: Negative for rash and wound.  Neurological: Negative for seizures and speech difficulty.  Hematological: Negative for adenopathy. Does not bruise/bleed easily.  Psychiatric/Behavioral: Negative for agitation and suicidal ideas.   positive for attention deficit disorder.   Objective: Vital Signs: There were no vitals taken for this visit.  Physical Exam  Constitutional: He is oriented to person, place, and time. He appears well-developed and well-nourished.  HENT:  Head: Normocephalic and atraumatic.  Eyes: EOM are normal. Pupils are equal, round, and  reactive to light.  Neck: No tracheal deviation present. No thyromegaly present.  Cardiovascular: Normal rate.   Pulmonary/Chest: Effort normal. He has no wheezes.  Abdominal: Soft. Bowel sounds are normal.  Musculoskeletal:  Patient cannot do a single stance toe raise on the left he has left calf atrophy. He can heel walk. Has some parasternal leg raising 80 negative proctoscopy compression  test. Lumbar midline incision from the L3-4 and L4-5 level. No cellulitis. Range of motion is normal knees reach full extension good flexion. No synovitis of the upper extremities fingers wrists elbows.  Neurological: He is alert and oriented to person, place, and time.  Skin: Skin is warm and dry. Capillary refill takes less than 2 seconds.  Psychiatric: He has a normal mood and affect. His behavior is normal. Judgment and thought content normal.    Ortho Exam  Specialty Comments:  No specialty comments available.  Imaging: No results found.   PMFS History: Patient Active Problem List   Diagnosis Date Noted  . Chronic midline low back pain without sciatica 12/07/2015  . Arthritis 12/07/2015  . ADD (attention deficit disorder) 12/05/2010   Past Medical History:  Diagnosis Date  . ADD (attention deficit disorder)   . DDD (degenerative disc disease), cervical 04/2002  . Diverticulosis   . Fibromyalgia   . Hemorrhoids   . Pancreatic alpha-amylase deficiency (HCC)     Family History  Problem Relation Age of Onset  . Arthritis Mother   . Arthritis Maternal Grandmother   . Heart disease Neg Hx   . Hyperlipidemia Neg Hx   . Hypertension Neg Hx   . Stroke Neg Hx   . Cancer Neg Hx     Past Surgical History:  Procedure Laterality Date  . LAMINECTOMY Bilateral    decompression od L3-L4, L4-L5 with bilateral foraminotomies via L4 laminectomy with undercutting L3 and L5 lamina.  . LUMBAR DISC SURGERY Left    Left L3-L4 diskectomy  . NASAL SINUS SURGERY     Social History   Occupational History  . freelance broadcast sports [employer]   Social History Main Topics  . Smoking status: Never Smoker  . Smokeless tobacco: Never Used  . Alcohol use 0.5 oz/week    1 Standard drinks or equivalent per week  . Drug use: No  . Sexual activity: Not Currently     Comment: works in television, golf, married;

## 2016-03-10 NOTE — Addendum Note (Signed)
Addended by: Rogers SeedsYEATTS, Aseem Sessums M on: 03/10/2016 03:32 PM   Modules accepted: Orders

## 2016-03-20 NOTE — Telephone Encounter (Signed)
dt ?

## 2016-03-24 ENCOUNTER — Telehealth: Payer: Self-pay | Admitting: Family Medicine

## 2016-03-24 DIAGNOSIS — M1990 Unspecified osteoarthritis, unspecified site: Secondary | ICD-10-CM

## 2016-03-24 DIAGNOSIS — M545 Low back pain: Secondary | ICD-10-CM

## 2016-03-24 DIAGNOSIS — G8929 Other chronic pain: Secondary | ICD-10-CM

## 2016-03-24 MED ORDER — DICLOFENAC SODIUM 75 MG PO TBEC: 75.0000 mg | DELAYED_RELEASE_TABLET | Freq: Two times a day (BID) | ORAL | 1 refills | Status: DC

## 2016-03-24 NOTE — Telephone Encounter (Signed)
Let him know that I called it in. He was supposed to be for one refill with 30 pills.

## 2016-03-24 NOTE — Telephone Encounter (Signed)
Pt says he was given a script for Voltaren recently and it is for #1. Pt wants to know if he can get #30 since he works out of town.

## 2016-03-24 NOTE — Telephone Encounter (Signed)
Pt informed/verbalized understanding

## 2016-04-03 ENCOUNTER — Ambulatory Visit
Admission: RE | Admit: 2016-04-03 | Discharge: 2016-04-03 | Disposition: A | Discharge status: Home / Self Care | Payer: Managed Care, Other (non HMO) | Source: Ambulatory Visit | Attending: Orthopaedic Surgery | Admitting: Orthopaedic Surgery

## 2016-04-03 DIAGNOSIS — G8929 Other chronic pain: Secondary | ICD-10-CM

## 2016-04-03 DIAGNOSIS — M5442 Lumbago with sciatica, left side: Principal | ICD-10-CM

## 2016-04-03 MED ORDER — GADOBENATE DIMEGLUMINE 529 MG/ML IV SOLN
17.0000 mL | Freq: Once | INTRAVENOUS | Status: AC | PRN
  Administered 2016-04-03: 17 mL via INTRAVENOUS

## 2016-04-07 ENCOUNTER — Encounter (INDEPENDENT_AMBULATORY_CARE_PROVIDER_SITE_OTHER): Payer: Self-pay | Admitting: Orthopaedic Surgery

## 2016-04-07 ENCOUNTER — Ambulatory Visit (INDEPENDENT_AMBULATORY_CARE_PROVIDER_SITE_OTHER): Payer: Managed Care, Other (non HMO) | Admitting: Orthopaedic Surgery

## 2016-04-07 VITALS — BP 159/92 | HR 60 | Ht 72.0 in | Wt 184.0 lb

## 2016-04-07 DIAGNOSIS — M545 Low back pain: Secondary | ICD-10-CM

## 2016-04-07 DIAGNOSIS — G8929 Other chronic pain: Secondary | ICD-10-CM | POA: Diagnosis not present

## 2016-04-07 NOTE — Progress Notes (Signed)
Office Visit Note   Patient: Nicholas [name]           Date of Birth: [birth year]           MRN: [id number] Visit Date: 04/07/2016              Requested by: Ronnald Nian, MD [address] [place], [place] [address] PCP: Carollee Herter, MD   Assessment & Plan: Visit Diagnoses:  1. Chronic midline low back pain without sciatica     Plan: Patient's MRI shows some adequate decompression L3-4 L4-5. Has broad-based foraminal disc protrusion at L3-4. Principal problem has been some residual left calf weakness after the surgery. He had previous tears of the medial head of gastroc both right and left. He's gone through physical therapy trying to strengthen his calf 2 different therapy places without significant improvement. At this point I told him I would not recommend additional surgery. He can alter his activities use the bike for further cardiovascular work. He was resumed trying to play some golf think that would be fine. Discussed gradually ramping up that activity slowly. He states he hasn't been able to run in several years. I discussed with him the recommend other activities for cardiovascular fitness such as stationary bike or he can get a trial bike and rides trails. We discussed road bike safety, risks of cars etc. Questions were elicited and answered I gave him a copy of the MRI report. Office follow-up when necessary.  Follow-Up Instructions: Return if symptoms worsen or fail to improve.   Orders:  No orders of the defined types were placed in this encounter.  No orders of the defined types were placed in this encounter.     Procedures: No procedures performed   Clinical Data: No additional findings.   Subjective: Chief Complaint  Patient presents with  . Lower Back - Follow-up  . Follow-up    Patient is here to discuss his MRI Results of Lumbar Spine.    Review of Systems 14 point review of systems is updated and is unchanged from office visit  03/10/2016 other than as mentioned in this note.   Objective: Vital Signs: BP (!) 159/92   Pulse 60   Ht 6' (1.829 m)   Wt 184 lb (83.5 kg)   BMI 24.95 kg/m   Physical Exam  Constitutional: He is oriented to person, place, and time. He appears well-developed and well-nourished.  HENT:  Head: Normocephalic and atraumatic.  Eyes: EOM are normal. Pupils are equal, round, and reactive to light.  Neck: No tracheal deviation present. No thyromegaly present.  Cardiovascular: Normal rate.   Pulmonary/Chest: Effort normal. He has no wheezes.  Abdominal: Soft. Bowel sounds are normal.  Musculoskeletal:  Well-healed lumbar incision. Negative straight leg raising. He has some atrophy of both medial head of the gastrocs from old gastroc tears. He can single stance toe raise on the right but not on the left. Anterior tib quad abductors are strong. EHL is strong.  Neurological: He is alert and oriented to person, place, and time.  Skin: Skin is warm and dry. Capillary refill takes less than 2 seconds.  Psychiatric: He has a normal mood and affect. His behavior is normal. Judgment and thought content normal.    Ortho Exam  Specialty Comments:  No specialty comments available.  Imaging: No results found. Show images for MR LUMBAR SPINE W WO CONTRAST  Study Result   CLINICAL DATA:  Low back pain with weakness in the left  leg and occasional numbness for 13 months. Back surgery in 2014.  EXAM: MRI LUMBAR SPINE WITHOUT AND WITH CONTRAST  TECHNIQUE: Multiplanar and multiecho pulse sequences of the lumbar spine were obtained without and with intravenous contrast.  CONTRAST:  17mL MULTIHANCE GADOBENATE DIMEGLUMINE 529 MG/ML IV SOLN  COMPARISON:  Radiographs 03/10/2016.  MRI 12/10/2014.  FINDINGS: Segmentation: Conventional anatomy assumed, with the last open disc space designated L5-S1.  Alignment:  Normal.  Vertebrae: Interval healing of previous demonstrated  inferior endplate Schmorl's nodes at L2 and L4. There are mildly progressive endplate degenerative changes at L1-2 and L3-4, but no acute osseous findings. The visualized sacroiliac joints appear unremarkable.  Conus medullaris: Extends to the T12-L1 level and appears normal.  Paraspinal and other soft tissues: No significant paraspinal findings.  Disc levels:  L1-2: Stable annular disc bulging and mild narrowing of the left lateral recess. The foramina are patent.  L2-3: Stable annular disc bulging mildly eccentric to the left. There is mild facet and ligamentous hypertrophy, contributing to stable mild spinal stenosis. The foramina are patent.  L3-4: Interval laminectomy and discectomy. The spinal canal is well-decompressed. There is annular disc bulging with a broad-based left foraminal disc protrusion which may contribute to left L4 nerve root encroachment in the lateral recess. No encroachment on the exiting L3 nerve root seen.  L4-5: Interval bilateral laminectomy with adequate decompression of the spinal canal. There is annular disc bulging with improvement in the previously demonstrated endplate edema. There is mild inferior foraminal narrowing bilaterally without L4 nerve root encroachment.  L5-S1: Mild disc bulging and facet hypertrophy. Interval improvement in previously demonstrated endplate edema in the left S1 superior endplate. No spinal stenosis or nerve root encroachment.  IMPRESSION: 1. Interval surgery at L3-4 and L4-5 with adequate decompression of the spinal canal. There is a broad-based left foraminal disc protrusion at L3-4 which may contribute to left L4 nerve root encroachment in the lateral recess. No exiting nerve root encroachment seen. 2. Interval improvement in endplate edema at Z6-1 and L4-5. Overall, endplate degenerative changes have mildly progressed without acute osseous findings.   Electronically Signed   By: Carey Bullocks  M.D.   On: 04/03/2016 11:28     PMFS History: Patient Active Problem List   Diagnosis Date Noted  . Chronic midline low back pain without sciatica 12/07/2015  . Arthritis 12/07/2015  . ADD (attention deficit disorder) 12/05/2010   Past Medical History:  Diagnosis Date  . ADD (attention deficit disorder)   . DDD (degenerative disc disease), cervical 04/2002  . Diverticulosis   . Fibromyalgia   . Hemorrhoids   . Pancreatic alpha-amylase deficiency (HCC)     Family History  Problem Relation Age of Onset  . Arthritis Mother   . Arthritis Maternal Grandmother   . Heart disease Neg Hx   . Hyperlipidemia Neg Hx   . Hypertension Neg Hx   . Stroke Neg Hx   . Cancer Neg Hx     Past Surgical History:  Procedure Laterality Date  . LAMINECTOMY Bilateral    decompression od L3-L4, L4-L5 with bilateral foraminotomies via L4 laminectomy with undercutting L3 and L5 lamina.  . LUMBAR DISC SURGERY Left    Left L3-L4 diskectomy  . NASAL SINUS SURGERY     Social History   Occupational History  . freelance broadcast sports [employer]   Social History Main Topics  . Smoking status: Never Smoker  . Smokeless tobacco: Never Used  . Alcohol use 0.5 oz/week  1 Standard drinks or equivalent per week  . Drug use: No  . Sexual activity: Not Currently     Comment: works in television, golf, married;

## 2016-04-28 ENCOUNTER — Other Ambulatory Visit: Payer: Self-pay | Admitting: Family Medicine

## 2016-04-28 DIAGNOSIS — M1990 Unspecified osteoarthritis, unspecified site: Secondary | ICD-10-CM

## 2016-04-28 DIAGNOSIS — M545 Low back pain, unspecified: Secondary | ICD-10-CM

## 2016-04-28 DIAGNOSIS — G8929 Other chronic pain: Secondary | ICD-10-CM

## 2016-04-28 NOTE — Telephone Encounter (Signed)
Is this okay to refill? 

## 2016-06-16 ENCOUNTER — Telehealth: Payer: Self-pay | Admitting: Neurology

## 2016-06-16 NOTE — Telephone Encounter (Addendum)
Previous documentation dated June 16 2016 was by mistake. The information was deleted and copied to the correct patient's chart.

## 2016-06-21 NOTE — Telephone Encounter (Addendum)
Previous note documented in error 

## 2016-07-12 ENCOUNTER — Telehealth: Payer: Self-pay | Admitting: Family Medicine

## 2016-07-12 ENCOUNTER — Telehealth: Payer: Self-pay

## 2016-07-12 DIAGNOSIS — M545 Low back pain, unspecified: Secondary | ICD-10-CM

## 2016-07-12 DIAGNOSIS — M1990 Unspecified osteoarthritis, unspecified site: Secondary | ICD-10-CM

## 2016-07-12 DIAGNOSIS — F988 Other specified behavioral and emotional disorders with onset usually occurring in childhood and adolescence: Secondary | ICD-10-CM

## 2016-07-12 DIAGNOSIS — G8929 Other chronic pain: Secondary | ICD-10-CM

## 2016-07-12 MED ORDER — DICLOFENAC SODIUM 75 MG PO TBEC: 75.0000 mg | DELAYED_RELEASE_TABLET | Freq: Two times a day (BID) | ORAL | 1 refills | Status: DC

## 2016-07-12 MED ORDER — AMPHETAMINE-DEXTROAMPHET ER 30 MG PO CP24: 30.0000 mg | ORAL_CAPSULE | ORAL | 0 refills | Status: DC

## 2016-07-12 MED ORDER — AMPHETAMINE-DEXTROAMPHET ER 30 MG PO CP24: 30.0000 mg | ORAL_CAPSULE | Freq: Every day | ORAL | 0 refills | Status: DC

## 2016-07-12 NOTE — Telephone Encounter (Signed)
LM that Adderall script at front desk for pick up. Trixie Rude/RLB

## 2016-07-12 NOTE — Telephone Encounter (Signed)
Pt requesting month supply of Diclofenac 75 mg & refill on Adderall XR 30 mg. Call pt at [phone] when script is ready for pick up

## 2016-09-08 ENCOUNTER — Other Ambulatory Visit: Payer: Self-pay | Admitting: Family Medicine

## 2016-09-08 DIAGNOSIS — G8929 Other chronic pain: Secondary | ICD-10-CM

## 2016-09-08 DIAGNOSIS — M545 Low back pain, unspecified: Secondary | ICD-10-CM

## 2016-09-08 DIAGNOSIS — M1990 Unspecified osteoarthritis, unspecified site: Secondary | ICD-10-CM

## 2016-09-08 NOTE — Telephone Encounter (Signed)
Can he refill on this ?

## 2016-09-19 ENCOUNTER — Ambulatory Visit (INDEPENDENT_AMBULATORY_CARE_PROVIDER_SITE_OTHER): Payer: Managed Care, Other (non HMO) | Admitting: Family Medicine

## 2016-09-19 DIAGNOSIS — M62562 Muscle wasting and atrophy, not elsewhere classified, left lower leg: Secondary | ICD-10-CM | POA: Diagnosis not present

## 2016-09-19 NOTE — Progress Notes (Signed)
Subjective:    Patient ID: Nicholas [name], male    DOB: 10-24-1955, 61 y.o.   MRN: [id number]  HPI He is here for consultation concerning continued difficulty with weakness in the left leg specifically the calf muscles. He did have back surgery in 2016 and since then has noted decreased ability to plantar flex on the left. He notes now increased atrophy of the left calf muscle. He states that the pain no longer radiates down his back but still does have some back discomfort. Uncertain over the continued atrophy and wonders if anything can be done differently. Apparently he did discuss this with Dr. Annell Greening in the past who recommended no further intervention unless he had further difficulty.   Review of Systems     Objective:   Physical Exam Alert and in no distress. Obvious Atrophy is noted on the left. He cannot plantar flex standing on the left leg but does have good dorsiflexion.       Assessment & Plan:  Left calf atrophy  Case was discussed with Dr. Ophelia Charter who indicated that he had calf muscle tearing as the primary reason for his weakness. The MRI actually showed an L4-5 issue which would not cause his problem with plantar flexion. I related this information to the patient. He will probably get another opinion with a different surgeon concerning this.

## 2016-11-08 ENCOUNTER — Telehealth: Payer: Self-pay | Admitting: Family Medicine

## 2016-11-08 DIAGNOSIS — F988 Other specified behavioral and emotional disorders with onset usually occurring in childhood and adolescence: Secondary | ICD-10-CM

## 2016-11-08 MED ORDER — AMPHETAMINE-DEXTROAMPHET ER 30 MG PO CP24: 30.0000 mg | ORAL_CAPSULE | ORAL | 0 refills | Status: DC

## 2016-11-08 MED ORDER — AMPHETAMINE-DEXTROAMPHET ER 30 MG PO CP24: 30.0000 mg | ORAL_CAPSULE | Freq: Every day | ORAL | 0 refills | Status: DC

## 2016-11-08 NOTE — Telephone Encounter (Signed)
  Patient needs refill on adderall  Please call when ready

## 2017-01-29 ENCOUNTER — Ambulatory Visit
Admission: RE | Admit: 2017-01-29 | Discharge: 2017-01-29 | Disposition: A | Discharge status: Home / Self Care | Payer: Managed Care, Other (non HMO) | Source: Ambulatory Visit | Attending: Family Medicine | Admitting: Family Medicine

## 2017-01-29 ENCOUNTER — Encounter: Payer: Self-pay | Admitting: Family Medicine

## 2017-01-29 ENCOUNTER — Ambulatory Visit: Payer: Managed Care, Other (non HMO) | Admitting: Family Medicine

## 2017-01-29 VITALS — BP 120/70 | HR 65 | Ht 71.0 in | Wt 173.6 lb

## 2017-01-29 DIAGNOSIS — Z0000 Encounter for general adult medical examination without abnormal findings: Secondary | ICD-10-CM | POA: Diagnosis not present

## 2017-01-29 DIAGNOSIS — Z1159 Encounter for screening for other viral diseases: Secondary | ICD-10-CM

## 2017-01-29 DIAGNOSIS — M1990 Unspecified osteoarthritis, unspecified site: Secondary | ICD-10-CM | POA: Diagnosis not present

## 2017-01-29 DIAGNOSIS — M25572 Pain in left ankle and joints of left foot: Secondary | ICD-10-CM

## 2017-01-29 DIAGNOSIS — M62562 Muscle wasting and atrophy, not elsewhere classified, left lower leg: Secondary | ICD-10-CM | POA: Diagnosis not present

## 2017-01-29 DIAGNOSIS — Z23 Encounter for immunization: Secondary | ICD-10-CM

## 2017-01-29 DIAGNOSIS — Z1211 Encounter for screening for malignant neoplasm of colon: Secondary | ICD-10-CM

## 2017-01-29 DIAGNOSIS — F988 Other specified behavioral and emotional disorders with onset usually occurring in childhood and adolescence: Secondary | ICD-10-CM

## 2017-01-29 LAB — POCT URINALYSIS DIP (PROADVANTAGE DEVICE)
BILIRUBIN UA: NEGATIVE
BILIRUBIN UA: NEGATIVE mg/dL
GLUCOSE UA: NEGATIVE mg/dL
Leukocytes, UA: NEGATIVE
Nitrite, UA: NEGATIVE
PH UA: 6 (ref 5.0–8.0)
Protein Ur, POC: NEGATIVE mg/dL
RBC UA: NEGATIVE
Specific Gravity, Urine: 1.025
Urobilinogen, Ur: NEGATIVE

## 2017-01-29 MED ORDER — AMPHETAMINE-DEXTROAMPHET ER 30 MG PO CP24: 30.0000 mg | ORAL_CAPSULE | ORAL | 0 refills | Status: DC

## 2017-01-29 MED ORDER — AMPHETAMINE-DEXTROAMPHET ER 30 MG PO CP24: 30.0000 mg | ORAL_CAPSULE | Freq: Every day | ORAL | 0 refills | Status: DC

## 2017-01-29 NOTE — Progress Notes (Signed)
Subjective:    Patient ID: Nicholas [name], male    DOB: Apr 08, 1955, 61 y.o.   MRN: [id number]  HPI He is here for complete examination.  He continues to  have difficulty with his left calf.  Since he has had surgery he has noted a decrease in his calf strength.  He has been seen in the past by Dr. Ophelia Charter for this.  He is wondering if there are any other options concerning this and would like to get this further evaluated.  He does have underlying arthritic changes in his knees.  He has had difficulty with his left ankle for the last 3 months feeling as if it locks and then when he shakes it, it will then return to normal.  No previous history of injury to that.  He continues on his ADD medicines and gets roughly 6 hours of benefit with increased focus.  This usually is enough for him.  Family and social history as well as health maintenance and immunizations were reviewed.   Review of Systems  All other systems reviewed and are negative.      Objective:   Physical Exam BP 120/70   Pulse 65   Ht 5\' 11"  (1.803 m)   Wt 173 lb 9.6 oz (78.7 kg)   SpO2 98%   BMI 24.21 kg/m   General Appearance:    Alert, cooperative, no distress, appears stated age  Head:    Normocephalic, without obvious abnormality, atraumatic  Eyes:    PERRL, conjunctiva/corneas clear, EOM's intact, fundi    benign  Ears:    Normal TM's and external ear canals  Nose:   Nares normal, mucosa normal, no drainage or sinus   tenderness  Throat:   Lips, mucosa, and tongue normal; teeth and gums normal  Neck:   Supple, no lymphadenopathy;  thyroid:  no   enlargement/tenderness/nodules; no carotid   bruit or JVD     Lungs:     Clear to auscultation bilaterally without wheezes, rales or     ronchi; respirations unlabored      Heart:    Regular rate and rhythm, S1 and S2 normal, no murmur, rub   or gallop     Abdomen:     Soft, non-tender, nondistended, normoactive bowel sounds,    no masses, no hepatosplenomegaly    Genitalia:    Normal male external genitalia without lesions.  Testicles without masses.  No inguinal hernias.  Rectal:   Deferred  Extremities:   No clubbing, cyanosis or edema.definite atrophy noted of the left calf.  Full motion of the left ankle with no tenderness or swelling or laxity.  Pulses:   2+ and symmetric all extremities  Skin:   Skin color, texture, turgor normal, no rashes or lesions  Lymph nodes:   Cervical, supraclavicular, and axillary nodes normal  Neurologic:   CNII-XII intact, normal strength, sensation and gait; reflexes 2+ and symmetric throughout          Psych:   Normal mood, affect, hygiene and grooming.           Assessment & Plan:  Routine general medical examination at a health care facility - Plan: POCT Urinalysis DIP (Proadvantage Device), CBC with Differential/Platelet, Comprehensive metabolic panel, Lipid panel  Need for shingles vaccine - Plan: Varicella-zoster vaccine IM (Shingrix)  Encounter for hepatitis C screening test for low risk patient - Plan: Hepatitis C antibody  Attention deficit disorder, unspecified hyperactivity presence - Plan: amphetamine-dextroamphetamine (ADDERALL XR) 30 MG  24 hr capsule, amphetamine-dextroamphetamine (ADDERALL XR) 30 MG 24 hr capsule, amphetamine-dextroamphetamine (ADDERALL XR) 30 MG 24 hr capsule  Left calf atrophy  Arthritis  Left ankle pain, unspecified chronicity - Plan: DG Ankle Complete Left  Screening for colon cancer - Plan: Cologuard  I will refer him for further evaluation and possible nerve conduction study concerning the calf atrophy.  Follow-up on the ankle after the the x-rays are complete.

## 2017-01-29 NOTE — Addendum Note (Signed)
Addended by: Minette HeadlandBENFIELD, Shakeira Rhee L on: 01/29/2017 02:45 PM   Modules accepted: Orders

## 2017-01-30 ENCOUNTER — Telehealth: Payer: Self-pay

## 2017-01-30 DIAGNOSIS — M62562 Muscle wasting and atrophy, not elsewhere classified, left lower leg: Secondary | ICD-10-CM

## 2017-01-30 LAB — COMPREHENSIVE METABOLIC PANEL
AG RATIO: 1.7 (calc) (ref 1.0–2.5)
ALKALINE PHOSPHATASE (APISO): 57 U/L (ref 40–115)
ALT: 19 U/L (ref 9–46)
AST: 30 U/L (ref 10–35)
Albumin: 4 g/dL (ref 3.6–5.1)
BUN: 13 mg/dL (ref 7–25)
CALCIUM: 9 mg/dL (ref 8.6–10.3)
CHLORIDE: 102 mmol/L (ref 98–110)
CO2: 24 mmol/L (ref 20–32)
Creat: 0.93 mg/dL (ref 0.70–1.25)
GLOBULIN: 2.3 g/dL (ref 1.9–3.7)
GLUCOSE: 92 mg/dL (ref 65–99)
Potassium: 4.5 mmol/L (ref 3.5–5.3)
Sodium: 138 mmol/L (ref 135–146)
Total Bilirubin: 0.9 mg/dL (ref 0.2–1.2)
Total Protein: 6.3 g/dL (ref 6.1–8.1)

## 2017-01-30 LAB — LIPID PANEL
CHOLESTEROL: 217 mg/dL — AB (ref <200)
HDL: 97 mg/dL (ref >40)
LDL Cholesterol (Calc): 105 mg/dL (calc) — ABNORMAL HIGH
Non-HDL Cholesterol (Calc): 120 mg/dL (calc) (ref <130)
TRIGLYCERIDES: 65 mg/dL (ref <150)
Total CHOL/HDL Ratio: 2.2 (calc) (ref <5.0)

## 2017-01-30 LAB — CBC WITH DIFFERENTIAL/PLATELET
BASOS ABS: 90 {cells}/uL (ref 0–200)
BASOS PCT: 1.8 %
Eosinophils Absolute: 160 cells/uL (ref 15–500)
Eosinophils Relative: 3.2 %
HCT: 41.3 % (ref 38.5–50.0)
HEMOGLOBIN: 14.4 g/dL (ref 13.2–17.1)
Lymphs Abs: 1345 cells/uL (ref 850–3900)
MCH: 30.8 pg (ref 27.0–33.0)
MCHC: 34.9 g/dL (ref 32.0–36.0)
MCV: 88.4 fL (ref 80.0–100.0)
MONOS PCT: 10.3 %
MPV: 10 fL (ref 7.5–12.5)
NEUTROS ABS: 2890 {cells}/uL (ref 1500–7800)
Neutrophils Relative %: 57.8 %
Platelets: 256 10*3/uL (ref 140–400)
RBC: 4.67 10*6/uL (ref 4.20–5.80)
RDW: 12.4 % (ref 11.0–15.0)
TOTAL LYMPHOCYTE: 26.9 %
WBC: 5 10*3/uL (ref 3.8–10.8)
WBCMIX: 515 {cells}/uL (ref 200–950)

## 2017-01-30 LAB — HEPATITIS C ANTIBODY
Hepatitis C Ab: NONREACTIVE
SIGNAL TO CUT-OFF: 0.01 (ref <1.00)

## 2017-02-08 ENCOUNTER — Encounter: Payer: Managed Care, Other (non HMO) | Admitting: Family Medicine

## 2017-02-14 NOTE — Telephone Encounter (Signed)
error 

## 2017-02-28 ENCOUNTER — Encounter: Payer: Self-pay | Admitting: Neurology

## 2017-02-28 ENCOUNTER — Encounter (INDEPENDENT_AMBULATORY_CARE_PROVIDER_SITE_OTHER): Payer: Self-pay

## 2017-02-28 ENCOUNTER — Ambulatory Visit: Payer: 59 | Admitting: Neurology

## 2017-02-28 VITALS — BP 140/79 | HR 67 | Ht 72.0 in | Wt 175.0 lb

## 2017-02-28 DIAGNOSIS — M62562 Muscle wasting and atrophy, not elsewhere classified, left lower leg: Secondary | ICD-10-CM

## 2017-02-28 DIAGNOSIS — R29898 Other symptoms and signs involving the musculoskeletal system: Secondary | ICD-10-CM

## 2017-02-28 NOTE — Patient Instructions (Signed)
Your physician has ordered a Nerve Conduction Study (NCV) and/or EMG testing.  This is a test to assess the status of your nerves and muscles. For the NCV portion of the test , sticky tabs will be placed on either your hands or feet.  Your nerves will be stimulated using small electrical charges and the speed of the impulse will be measured as it travels down the nerve. For the EMG portion of the test, a small pin will be placed below the surface of the skin to measure the electrical activity in certain muscles in your arms or legs. Eating/drinking prior to testing is ok.  Please DO NOT discontinue ANY medications prior to testing  Your appointment will be scheduled by a member of our team.  You will need to check in with the main reception desk. Your test could take approximately 30 minutes to 1 hour to complete depending on the extent of the test that has been ordered. TESTING ON LEGS/BACK/HIP/FOOT AREA: Bring/wear a pair of shorts.  **ABSOLUTELY NO LOTIONS, MOISTURIZERS, VASELINE, OILS OR CREAMS OF ANY KIND ON ANY BODY PART THE DAY OF THE TEST**  **DEODORANT IS OK TO WEAR**  Please make every effort to keep your scheduled appointment time, as your physician needs the test results prior to your next appointment.  If you have to cancel or reschedule, please do so as soon as possible, so that another patient may use that testing time slot.  If you cancel your appointment, you may also need to reschedule your referring doctor's appointment until the test and results can be completed.  Please call [phone] and ask for Dr. Newton's assistant if you need to do so.  If you have any questions at any time please let us know.  We look forward to working with you!! 
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
## 2017-03-14 LAB — COLOGUARD: COLOGUARD: NEGATIVE

## 2017-03-23 ENCOUNTER — Encounter: Payer: 59 | Admitting: Neurology

## 2017-03-27 ENCOUNTER — Encounter: Payer: Self-pay | Admitting: *Deleted

## 2017-04-04 ENCOUNTER — Telehealth: Payer: Self-pay

## 2017-04-04 DIAGNOSIS — G8929 Other chronic pain: Secondary | ICD-10-CM

## 2017-04-04 DIAGNOSIS — M545 Low back pain: Secondary | ICD-10-CM

## 2017-04-04 DIAGNOSIS — M1990 Unspecified osteoarthritis, unspecified site: Secondary | ICD-10-CM

## 2017-04-04 MED ORDER — DICLOFENAC SODIUM 75 MG PO TBEC: 75.0000 mg | DELAYED_RELEASE_TABLET | Freq: Two times a day (BID) | ORAL | 5 refills | Status: DC

## 2017-04-04 NOTE — Telephone Encounter (Signed)
Pharmacy is requesting a refill on Diclofenac SOD 75mg  . [facility] [address] Thanks [facility]KH

## 2017-04-17 ENCOUNTER — Encounter (INDEPENDENT_AMBULATORY_CARE_PROVIDER_SITE_OTHER): Payer: Self-pay

## 2017-04-17 ENCOUNTER — Encounter: Payer: 59 | Admitting: Neurology

## 2017-04-17 ENCOUNTER — Ambulatory Visit: Payer: 59 | Admitting: Family Medicine

## 2017-04-17 ENCOUNTER — Encounter: Payer: Self-pay | Admitting: Family Medicine

## 2017-04-17 ENCOUNTER — Ambulatory Visit (INDEPENDENT_AMBULATORY_CARE_PROVIDER_SITE_OTHER): Payer: 59 | Admitting: Neurology

## 2017-04-17 VITALS — BP 146/98 | HR 85 | Wt 169.2 lb

## 2017-04-17 DIAGNOSIS — M62562 Muscle wasting and atrophy, not elsewhere classified, left lower leg: Secondary | ICD-10-CM

## 2017-04-17 DIAGNOSIS — R5383 Other fatigue: Secondary | ICD-10-CM

## 2017-04-17 DIAGNOSIS — R29898 Other symptoms and signs involving the musculoskeletal system: Secondary | ICD-10-CM | POA: Diagnosis not present

## 2017-04-17 DIAGNOSIS — Z6379 Other stressful life events affecting family and household: Secondary | ICD-10-CM

## 2017-04-17 DIAGNOSIS — Z0289 Encounter for other administrative examinations: Secondary | ICD-10-CM

## 2017-04-17 DIAGNOSIS — Z23 Encounter for immunization: Secondary | ICD-10-CM

## 2017-04-17 MED ORDER — ALPRAZOLAM 0.25 MG PO TABS: 0.2500 mg | ORAL_TABLET | Freq: Two times a day (BID) | ORAL | 0 refills | Status: DC | PRN

## 2017-04-17 NOTE — Progress Notes (Signed)
Subjective:    Patient ID: Nicholas [name], male    DOB: 17-Oct-1955, 62 y.o.   MRN: [id number]  HPI He is here for consult concerning multiple issues.  He did have a Cologard test which was negative.  Also blood work was done and this was reviewed with him.  He does complain of over a years worth of difficulty with decreased energy, stamina, libido.  He would like to be checked further for that.  Also he is dealing with the fact that his mother is in hospice and he is going up there in the next couple days to help take care of her final days.  He seems to be handling this well but does have some difficulty with anxiety. He also had nerve conduction study done recently which apparently did show some S1 nerve root damage.  Previous MRI showed very little concern in that area but he does continue to have pain in the low back and inability to push off.   Review of Systems     Objective:   Physical Exam Alert and in no distress otherwise not examined       Assessment & Plan:  Fatigue, unspecified type - Plan: Testosterone, TSH  Stress due to illness of family member - Plan: ALPRAZolam (XANAX) 0.25 MG tablet  Need for shingles vaccine - Plan: Varicella-zoster vaccine IM (Shingrix) He is certainly under a lot of stress which could be affecting his fatigue as well as energy and fluid libido but I will check testosterone.  Also discussed the back pain and the S1 nerve root issue.  He will discuss this further with Dr. Ethelene Hal.  It might be worthwhile considering an epidural injection to see if it will help with the back pain but explained that it would not have any effect on the S1 dysfunction.

## 2017-04-17 NOTE — Procedures (Signed)
     HISTORY:  Nicholas MarvelVictor [name] is a 62 year old gentleman with a history of lumbosacral spine surgery 2 years ago.  The patient had severe pain down both legs prior to surgery, following surgery the pain has improved significantly, but he now has weakness and atrophy of the calf muscle on the left.  The patient is being evaluated for this issue.  NERVE CONDUCTION STUDIES:  Nerve conduction studies were performed on the left upper extremity. The distal motor latencies and motor amplitudes for the median and ulnar nerves were within normal limits. The F wave latencies and nerve conduction velocities for these nerves were also normal, with exception that the F-wave latency for the left ulnar nerve was slightly prolonged. The sensory latencies for the median and ulnar nerves were normal.  Nerve conduction studies were performed on both lower extremities.  The distal motor latencies for the peroneal nerves were prolonged bilaterally with a low motor amplitude on the left and normal on the right.  The distal motor latencies for the posterior tibial nerves were prolonged on the left, normal on the right, with normal motor amplitudes for these nerves bilaterally.  The nerve conduction velocities for the peroneal and posterior tibial nerves were normal bilaterally.  The peroneal sensory latencies were within normal limits bilaterally.  The H reflex latencies were unobtainable bilaterally.  EMG STUDIES:  EMG study was performed on the left lower extremity:  The tibialis anterior muscle reveals 2 to 4K motor units with full recruitment. No fibrillations or positive waves were seen. The peroneus tertius muscle reveals 2 to 4K motor units with decreased recruitment. No fibrillations or positive waves were seen. The medial gastrocnemius muscle reveals 1 to 2K motor units with markedly reduced recruitment. 2+ fibrillations were seen. The vastus lateralis muscle reveals 2 to 4K motor units with full recruitment.  No fibrillations or positive waves were seen. The iliopsoas muscle reveals 2 to 4K motor units with full recruitment. No fibrillations or positive waves were seen. The biceps femoris muscle (long head) reveals 2 to 4K motor units with decreased recruitment. No fibrillations or positive waves were seen. The lumbosacral paraspinal muscles were tested at 3 levels, and revealed no abnormalities of insertional activity at all 3 levels tested. There was good relaxation.   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  EMG evaluation of the left lower extremity shows changes consistent with a significant S1 radiculopathy that is primarily chronic.  No significant L5 involvement is seen.  Nicholas [name]. Nicholas Laiba Fuerte MD 04/17/2017 10:02 AM  [facility] [address] [place], [place] [id number]  Phone [phone] Fax [phone]

## 2017-04-17 NOTE — Progress Notes (Signed)
[facility]    Nerve / Sites Muscle Latency Ref. Amplitude Ref. Rel Amp Segments Distance Velocity Ref. Area    ms ms mV mV %  cm m/s m/s mVms  L Median - APB     Wrist APB 3.4 ?4.4 13.6 ?4.0 100 Wrist - APB 7   47.2     Upper arm APB 7.8  13.3  98.1 Upper arm - Wrist 24 54 ?49 47.0  L Ulnar - ADM     Wrist ADM 2.9 ?3.3 8.8 ?6.0 100 Wrist - ADM 7   42.9     B.Elbow ADM 6.4  8.1  91.9 B.Elbow - Wrist 22 62 ?49 41.3     A.Elbow ADM 8.0  7.9  97.2 A.Elbow - B.Elbow 10 64 ?49 40.2         A.Elbow - Wrist      L Peroneal - EDB     Ankle EDB 8.9 ?6.5 0.2 ?2.0 100 Ankle - EDB 9   0.5     Fib head EDB 17.9  0.0  0.69 Fib head - Ankle 39 44 ?44 0.4     Pop fossa EDB 20.1  0.0  1000 Pop fossa - Fib head 10 46 ?44 0.4         Pop fossa - Ankle      R Peroneal - EDB     Ankle EDB 6.9 ?6.5 2.4 ?2.0 100 Ankle - EDB 9   8.6     Fib head EDB 14.9  2.9  121 Fib head - Ankle 39 49 ?44 10.4     Pop fossa EDB 16.7  3.2  112 Pop fossa - Fib head 10 55 ?44 11.2         Pop fossa - Ankle      L Tibial - AH     Ankle AH 6.0 ?5.8 6.9 ?4.0 100 Ankle - AH 9   16.7     Pop fossa AH 15.8  3.3  47.4 Pop fossa - Ankle 45 46 ?41 11.5  R Tibial - AH     Ankle AH 5.5 ?5.8 5.4 ?4.0 100 Ankle - AH 9   14.8     Pop fossa AH 16.0  3.5  65.9 Pop fossa - Ankle 44 42 ?41 10.2                 SNC    Nerve / Sites Rec. Site Peak Lat Amp Segments Distance    ms V  cm  L Median - Orthodromic (Dig II, Mid palm)     Mid palm Wrist 3.6 26 Mid palm - Wrist 8  L Ulnar - Orthodromic, (Dig V, Mid palm)     Dig V Wrist 3.4 26 Dig V - Wrist 11         SNC    Nerve / Sites Rec. Site Peak Lat Ref.  Amp Ref. Segments Distance    ms ms V V  cm  R Superficial peroneal - Ankle     Lat leg Ankle 3.1 ?4.4 7 ?6 Lat leg - Ankle 14  L Superficial peroneal - Ankle     Lat leg Ankle 2.7 ?4.4 6 ?6 Lat leg - Ankle 14         F  Wave    Nerve F Lat Ref.   ms ms  L Median - APB 28.4 ?31.0  L Ulnar - ADM 32.7 ?32.0  H Reflex      Nerve H Lat Lat Hmax   ms ms   Left Right Ref. Left Right Ref.  Tibial - Soleus NR NR ?35.0 NR NR ?35.0         EMG

## 2017-04-18 LAB — SPECIMEN STATUS

## 2017-04-18 LAB — TESTOSTERONE: Testosterone: 670 ng/dL (ref 264–916)

## 2017-04-18 LAB — TSH: TSH: 1.39 u[IU]/mL (ref 0.450–4.500)

## 2017-04-19 ENCOUNTER — Telehealth: Payer: Self-pay | Admitting: *Deleted

## 2017-04-19 DIAGNOSIS — M5417 Radiculopathy, lumbosacral region: Secondary | ICD-10-CM

## 2017-04-19 NOTE — Telephone Encounter (Addendum)
Spoke with patient and discussed the following NCV results from Dr. Lucia GaskinsAhern: the changes in his leg are due to a pinched nerve in his low back which is at the S1 level; this is due to Radiculopathy from his lumbosacral spine. Dr. Lucia GaskinsAhern has stated that he should follow up with his back surgeon to decide whether any further intervention would improve his condition. The pt verbalized understanding and refused to [facility] He stated if he can be referred to another one he will listen. I suggested a referral to [facility] and he was appreciative.    ----- Message from Anson FretAntonia B Ahern, MD sent at 04/18/2017  6:02 PM EST ----- Toma CopierBethany, the changes is Mr. Bronson Ingermar's leg are due to a pinched nerve in his low back which is at the S1 level; this is due to Radiculopathy from his lumbosacral spine. He should follow up with his back surgeon to decide whether any further intervention would improve his condition. thanks

## 2017-04-19 NOTE — Telephone Encounter (Signed)
Yes, please refer him thanks

## 2017-04-20 NOTE — Addendum Note (Signed)
Addended by: Bertram SavinULBERTSON, Jader Desai L on: 04/20/2017 09:57 AM   Modules accepted: Orders

## 2017-04-27 NOTE — Telephone Encounter (Signed)
Pt is needing a MRI before [facility] is [facility]. Pt will be back in town on 05/15/17

## 2017-04-30 ENCOUNTER — Institutional Professional Consult (permissible substitution): Payer: 59 | Admitting: Family Medicine

## 2017-05-02 ENCOUNTER — Other Ambulatory Visit: Payer: Self-pay | Admitting: Neurology

## 2017-05-02 DIAGNOSIS — M5417 Radiculopathy, lumbosacral region: Secondary | ICD-10-CM

## 2017-05-02 DIAGNOSIS — M62562 Muscle wasting and atrophy, not elsewhere classified, left lower leg: Secondary | ICD-10-CM

## 2017-05-02 NOTE — Telephone Encounter (Signed)
I ordered MRI lumbar spine thanks

## 2017-05-16 ENCOUNTER — Other Ambulatory Visit: Payer: Managed Care, Other (non HMO)

## 2017-05-23 ENCOUNTER — Telehealth: Payer: Self-pay | Admitting: Neurology

## 2017-05-23 NOTE — Telephone Encounter (Signed)
Nicholas OpitzAetna Auth: [id number]: [id number] 9exp. 05/17/17 to 08/15/17) patient is wanted to go to [facility] I faxed the order they will contact the patient to schedule.

## 2017-07-06 ENCOUNTER — Telehealth: Payer: Self-pay

## 2017-07-06 DIAGNOSIS — F988 Other specified behavioral and emotional disorders with onset usually occurring in childhood and adolescence: Secondary | ICD-10-CM

## 2017-07-06 NOTE — Telephone Encounter (Signed)
Patient called wanting refill on his adderall XR  [facility] [address]

## 2017-07-09 MED ORDER — AMPHETAMINE-DEXTROAMPHET ER 30 MG PO CP24: 30.0000 mg | ORAL_CAPSULE | ORAL | 0 refills | Status: DC

## 2017-07-09 MED ORDER — AMPHETAMINE-DEXTROAMPHET ER 30 MG PO CP24: 30.0000 mg | ORAL_CAPSULE | Freq: Every day | ORAL | 0 refills | Status: DC

## 2017-08-27 ENCOUNTER — Telehealth: Payer: Self-pay | Admitting: Family Medicine

## 2017-08-27 NOTE — Telephone Encounter (Signed)
Returned call to pt re: Adderall.  Reached Presenter, broadcastingvoice mail lmtrc. [phone] they have 2 rx there not filled for Adderall.  08/09/17 and 09/08/17 rx

## 2018-01-08 ENCOUNTER — Other Ambulatory Visit: Payer: Self-pay

## 2018-01-08 DIAGNOSIS — F988 Other specified behavioral and emotional disorders with onset usually occurring in childhood and adolescence: Secondary | ICD-10-CM

## 2018-01-08 MED ORDER — AMPHETAMINE-DEXTROAMPHET ER 30 MG PO CP24: 30.0000 mg | ORAL_CAPSULE | ORAL | 0 refills | Status: DC

## 2018-01-08 MED ORDER — AMPHETAMINE-DEXTROAMPHET ER 30 MG PO CP24: 30.0000 mg | ORAL_CAPSULE | Freq: Every day | ORAL | 0 refills | Status: DC

## 2018-01-08 NOTE — Telephone Encounter (Signed)
Patient called to request a refill on Adderall. Please advise.  

## 2018-02-12 ENCOUNTER — Other Ambulatory Visit: Payer: Self-pay | Admitting: Family Medicine

## 2018-02-12 DIAGNOSIS — M1990 Unspecified osteoarthritis, unspecified site: Secondary | ICD-10-CM

## 2018-02-12 DIAGNOSIS — M545 Low back pain: Secondary | ICD-10-CM

## 2018-02-12 DIAGNOSIS — G8929 Other chronic pain: Secondary | ICD-10-CM

## 2018-02-12 NOTE — Telephone Encounter (Signed)
[facility]

## 2018-03-11 ENCOUNTER — Ambulatory Visit: Payer: Commercial Managed Care - PPO | Admitting: Family Medicine

## 2018-03-11 ENCOUNTER — Encounter: Payer: Self-pay | Admitting: Family Medicine

## 2018-03-11 VITALS — BP 128/82 | HR 70 | Temp 98.1°F | Wt 177.6 lb

## 2018-03-11 DIAGNOSIS — G8929 Other chronic pain: Secondary | ICD-10-CM

## 2018-03-11 DIAGNOSIS — M5417 Radiculopathy, lumbosacral region: Secondary | ICD-10-CM

## 2018-03-11 DIAGNOSIS — M5442 Lumbago with sciatica, left side: Secondary | ICD-10-CM | POA: Diagnosis not present

## 2018-03-11 MED ORDER — OXYCODONE-ACETAMINOPHEN 10-325 MG PO TABS: 1.0000 | ORAL_TABLET | Freq: Three times a day (TID) | ORAL | 0 refills | Status: DC | PRN

## 2018-03-11 NOTE — Progress Notes (Signed)
Subjective:    Patient ID: SULEYMAN BOYTE, male    DOB: [birth year], 63 y.o.   MRN: [id number]  HPI He is here for consult concerning continued difficulty with low back pain.  He has had a several year history of difficulty with this and did have surgery in 2016 by Dr. Jiles Prows in [place].  He then developed what turned out to be an S1 radiculopathy.  This is definitely created functional difficulty since he can no longer push off and therefore cannot play the drums, martial arts and interferes with walking which is part of his job.  He is also had previous epidural injections prior to his recent surgery.  He now states that over the last 3 months his pain is gotten much worse.  He has been taking diclofenac with little relief of the symptoms.  He states that tramadol in the past has not been successful.  Apparently oxycodone does work well.  He is wondering what his options are.   Review of Systems     Objective:   Physical Exam Alert and in no distress otherwise not examined       Assessment & Plan:  Chronic left-sided low back pain with left-sided sciatica - Plan: oxyCODONE-acetaminophen (PERCOCET) 10-325 MG tablet, Ambulatory referral to Orthopedic Surgery  Lumbosacral radiculopathy at S1 - Plan: Ambulatory referral to Orthopedic Surgery He is getting ready to leave the country for work for the next several weeks.  I will give him oxycodone which has worked well in the past.  We will also send him back to Dr. Ethelene Hal for consideration of possible epidurals.

## 2018-03-17 ENCOUNTER — Telehealth: Payer: Self-pay | Admitting: Family Medicine

## 2018-03-17 NOTE — Telephone Encounter (Signed)
P.A. OXYCODONE-acetaminophen

## 2018-03-23 NOTE — Telephone Encounter (Signed)
P.A. approved til 03/27/19

## 2018-03-31 NOTE — Telephone Encounter (Signed)
P.A. approved til 02/24/20

## 2018-04-05 NOTE — Telephone Encounter (Signed)
Faxed pharmacy.

## 2018-04-09 DIAGNOSIS — M961 Postlaminectomy syndrome, not elsewhere classified: Secondary | ICD-10-CM | POA: Diagnosis not present

## 2018-04-09 DIAGNOSIS — M5416 Radiculopathy, lumbar region: Secondary | ICD-10-CM | POA: Insufficient documentation

## 2018-04-12 ENCOUNTER — Telehealth: Payer: Self-pay | Admitting: Family Medicine

## 2018-04-12 DIAGNOSIS — F988 Other specified behavioral and emotional disorders with onset usually occurring in childhood and adolescence: Secondary | ICD-10-CM

## 2018-04-12 DIAGNOSIS — M5442 Lumbago with sciatica, left side: Secondary | ICD-10-CM

## 2018-04-12 DIAGNOSIS — G8929 Other chronic pain: Secondary | ICD-10-CM

## 2018-04-12 MED ORDER — AMPHETAMINE-DEXTROAMPHET ER 30 MG PO CP24: 30.0000 mg | ORAL_CAPSULE | ORAL | 0 refills | Status: DC

## 2018-04-12 MED ORDER — AMPHETAMINE-DEXTROAMPHET ER 30 MG PO CP24: 30.0000 mg | ORAL_CAPSULE | Freq: Every day | ORAL | 0 refills | Status: DC

## 2018-04-12 MED ORDER — OXYCODONE-ACETAMINOPHEN 10-325 MG PO TABS: 1.0000 | ORAL_TABLET | Freq: Three times a day (TID) | ORAL | 0 refills | Status: DC | PRN

## 2018-04-12 NOTE — Telephone Encounter (Signed)
LVM for pt.

## 2018-04-12 NOTE — Telephone Encounter (Signed)
I called it in 

## 2018-04-12 NOTE — Telephone Encounter (Signed)
Pt left message and needs refills on Adderall and Hydrocodone. Pt stated that he will be going [place] town Monday. Pt can be reached at [phone]

## 2018-04-14 ENCOUNTER — Telehealth: Payer: Self-pay | Admitting: Family Medicine

## 2018-04-14 NOTE — Telephone Encounter (Signed)
P.A. ADDERALL XR  

## 2018-04-20 NOTE — Telephone Encounter (Signed)
P.A. approved til 04/15/20

## 2018-04-20 NOTE — Telephone Encounter (Signed)
Called pharmacy & pt picked up prescription already and went thru ins

## 2018-04-30 DIAGNOSIS — M5416 Radiculopathy, lumbar region: Secondary | ICD-10-CM | POA: Diagnosis not present

## 2018-04-30 DIAGNOSIS — M961 Postlaminectomy syndrome, not elsewhere classified: Secondary | ICD-10-CM | POA: Diagnosis not present

## 2018-05-24 DIAGNOSIS — M47816 Spondylosis without myelopathy or radiculopathy, lumbar region: Secondary | ICD-10-CM | POA: Insufficient documentation

## 2018-05-24 DIAGNOSIS — M5416 Radiculopathy, lumbar region: Secondary | ICD-10-CM | POA: Diagnosis not present

## 2018-05-24 DIAGNOSIS — M961 Postlaminectomy syndrome, not elsewhere classified: Secondary | ICD-10-CM | POA: Diagnosis not present

## 2018-06-04 DIAGNOSIS — M47816 Spondylosis without myelopathy or radiculopathy, lumbar region: Secondary | ICD-10-CM | POA: Diagnosis not present

## 2018-06-07 ENCOUNTER — Telehealth: Payer: Self-pay | Admitting: Family Medicine

## 2018-06-07 NOTE — Telephone Encounter (Signed)
He has an Rx that he can pick up on Apr 7

## 2018-06-07 NOTE — Telephone Encounter (Signed)
Pt called and is requesting a refill on his adderall xr pt needs [id number] [place], [place] - [address]

## 2018-06-07 NOTE — Telephone Encounter (Signed)
LVM for pt to advise [facility]

## 2018-06-17 ENCOUNTER — Telehealth: Payer: Self-pay | Admitting: Family Medicine

## 2018-06-17 NOTE — Telephone Encounter (Signed)
Pt called for his Adderall refill.  He said he was unable to get his meds.  [facility] [address] [phone] t/w Adelina Mings she said she had 2 on hold.  I asked why they were on hold.  She said because she had gotten two request.  I asked her if she could fill one of them and she said yes.    Called pt back advised him of same.

## 2018-07-04 DIAGNOSIS — M47816 Spondylosis without myelopathy or radiculopathy, lumbar region: Secondary | ICD-10-CM | POA: Diagnosis not present

## 2018-07-25 DIAGNOSIS — M47816 Spondylosis without myelopathy or radiculopathy, lumbar region: Secondary | ICD-10-CM | POA: Diagnosis not present

## 2018-08-01 ENCOUNTER — Telehealth: Payer: Self-pay | Admitting: Family Medicine

## 2018-08-01 DIAGNOSIS — F988 Other specified behavioral and emotional disorders with onset usually occurring in childhood and adolescence: Secondary | ICD-10-CM

## 2018-08-01 MED ORDER — AMPHETAMINE-DEXTROAMPHET ER 30 MG PO CP24: 30.0000 mg | ORAL_CAPSULE | Freq: Every day | ORAL | 0 refills | Status: DC

## 2018-08-01 MED ORDER — AMPHETAMINE-DEXTROAMPHET ER 30 MG PO CP24: 30.0000 mg | ORAL_CAPSULE | ORAL | 0 refills | Status: DC

## 2018-08-01 NOTE — Telephone Encounter (Signed)
Pt left message that he needs his Adderall refilled

## 2018-08-22 ENCOUNTER — Other Ambulatory Visit: Payer: Self-pay | Admitting: Family Medicine

## 2018-08-22 DIAGNOSIS — M545 Low back pain, unspecified: Secondary | ICD-10-CM

## 2018-08-22 DIAGNOSIS — M1990 Unspecified osteoarthritis, unspecified site: Secondary | ICD-10-CM

## 2018-08-22 DIAGNOSIS — G8929 Other chronic pain: Secondary | ICD-10-CM

## 2018-08-22 NOTE — Telephone Encounter (Signed)
Is this okay to refill? 

## 2018-09-16 ENCOUNTER — Ambulatory Visit: Payer: Commercial Managed Care - PPO | Admitting: Family Medicine

## 2018-09-16 ENCOUNTER — Encounter: Payer: Self-pay | Admitting: Family Medicine

## 2018-09-16 ENCOUNTER — Other Ambulatory Visit: Payer: Self-pay

## 2018-09-16 VITALS — BP 138/82 | HR 82 | Temp 99.2°F | Wt 172.0 lb

## 2018-09-16 DIAGNOSIS — Z9889 Other specified postprocedural states: Secondary | ICD-10-CM

## 2018-09-16 NOTE — Progress Notes (Signed)
Subjective:    Patient ID: Nicholas [name], male    DOB: 1956/01/31, 63 y.o.   MRN: [id number]  HPI He has a history of having 5 nasal surgeries interfering with his ability to breathe through his nose.  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   Review of Systems     Objective:   Physical Exam Alert and in no distress otherwise not examined       Assessment & Plan:   Encounter Diagnosis  Name Primary?  . History of nasal surgery Yes  I will give him a note stating that he has had previous nasal surgery interfering with his ability to breathe and only have him wear the mask when he is within 6 feet of other people.

## 2018-10-21 ENCOUNTER — Telehealth: Payer: Self-pay | Admitting: Family Medicine

## 2018-10-21 DIAGNOSIS — F988 Other specified behavioral and emotional disorders with onset usually occurring in childhood and adolescence: Secondary | ICD-10-CM

## 2018-10-21 MED ORDER — AMPHETAMINE-DEXTROAMPHET ER 30 MG PO CP24: 30.0000 mg | ORAL_CAPSULE | ORAL | 0 refills | Status: DC

## 2018-10-21 MED ORDER — AMPHETAMINE-DEXTROAMPHET ER 30 MG PO CP24: 30.0000 mg | ORAL_CAPSULE | Freq: Every day | ORAL | 0 refills | Status: DC

## 2018-10-21 NOTE — Telephone Encounter (Signed)
Pt left message needs refill [facility] [address]

## 2018-10-25 ENCOUNTER — Telehealth: Payer: Self-pay | Admitting: Family Medicine

## 2018-10-25 DIAGNOSIS — F988 Other specified behavioral and emotional disorders with onset usually occurring in childhood and adolescence: Secondary | ICD-10-CM

## 2018-10-25 MED ORDER — AMPHETAMINE-DEXTROAMPHET ER 30 MG PO CP24: 30.0000 mg | ORAL_CAPSULE | Freq: Every day | ORAL | 0 refills | Status: DC

## 2018-10-25 NOTE — Telephone Encounter (Signed)
Per our conversation please resend adderall rx

## 2018-12-17 ENCOUNTER — Other Ambulatory Visit: Payer: Self-pay

## 2018-12-17 ENCOUNTER — Ambulatory Visit: Payer: Commercial Managed Care - PPO | Admitting: Family Medicine

## 2018-12-17 VITALS — BP 118/74 | HR 79 | Temp 98.0°F | Wt 162.8 lb

## 2018-12-17 DIAGNOSIS — S61212A Laceration without foreign body of right middle finger without damage to nail, initial encounter: Secondary | ICD-10-CM | POA: Diagnosis not present

## 2018-12-17 NOTE — Progress Notes (Signed)
Subjective:    Patient ID: Nicholas [name], male    DOB: Jan 28, 1956, 63 y.o.   MRN: [id number]  HPI He injured his right third fingertip 2 days ago and was seen in an urgent care center.  He apparently did lacerate the very tip of his finger off.  He does complain of pain but no other problems.   Review of Systems     Objective:   Physical Exam Alert and in no distress.  Bandages noted on the right third fingertip.  The nail is pretty much intact.       Assessment & Plan:  Laceration of right middle finger without foreign body, nail damage status unspecified, initial encounter He was given a splint to cover up the finger so he does not have to worry about retraumatizing it.  Recommended he slowly let the dressing disintegrate and not take the dressing off as it would probably cause it to bleed again.  He was comfortable with that.

## 2019-01-07 ENCOUNTER — Telehealth: Payer: Self-pay | Admitting: Family Medicine

## 2019-01-07 DIAGNOSIS — F988 Other specified behavioral and emotional disorders with onset usually occurring in childhood and adolescence: Secondary | ICD-10-CM

## 2019-01-07 MED ORDER — AMPHETAMINE-DEXTROAMPHET ER 30 MG PO CP24: 30.0000 mg | ORAL_CAPSULE | ORAL | 0 refills | Status: DC

## 2019-01-07 MED ORDER — AMPHETAMINE-DEXTROAMPHET ER 30 MG PO CP24: 30.0000 mg | ORAL_CAPSULE | Freq: Every day | ORAL | 0 refills | Status: DC

## 2019-01-07 NOTE — Telephone Encounter (Signed)
Pt called for refills of adderall XR 30mg . Please send to [place].

## 2019-01-08 ENCOUNTER — Telehealth: Payer: Self-pay | Admitting: Family Medicine

## 2019-01-08 NOTE — Telephone Encounter (Signed)
Pt called and stated he was out of his Adderall [facility] spoke with [facility]ist and pt still had a prescription on file that he had not filled.  I called pt unable to leave a message

## 2019-02-18 ENCOUNTER — Other Ambulatory Visit: Payer: Self-pay | Admitting: Family Medicine

## 2019-02-18 DIAGNOSIS — G8929 Other chronic pain: Secondary | ICD-10-CM

## 2019-02-18 DIAGNOSIS — M1990 Unspecified osteoarthritis, unspecified site: Secondary | ICD-10-CM

## 2019-03-06 ENCOUNTER — Ambulatory Visit: Payer: Commercial Managed Care - PPO | Attending: Internal Medicine

## 2019-03-06 DIAGNOSIS — Z20822 Contact with and (suspected) exposure to covid-19: Secondary | ICD-10-CM

## 2019-03-08 LAB — NOVEL CORONAVIRUS, NAA: SARS-CoV-2, NAA: DETECTED — AB

## 2019-03-26 ENCOUNTER — Telehealth: Payer: Self-pay

## 2019-03-26 DIAGNOSIS — F988 Other specified behavioral and emotional disorders with onset usually occurring in childhood and adolescence: Secondary | ICD-10-CM

## 2019-03-26 NOTE — Telephone Encounter (Signed)
Pt. Called LM stating that he needs a refill on his Adderall pt. Last apt. 12/17/18

## 2019-03-27 MED ORDER — AMPHETAMINE-DEXTROAMPHET ER 30 MG PO CP24: 30.0000 mg | ORAL_CAPSULE | Freq: Every day | ORAL | 0 refills | Status: DC

## 2019-03-27 MED ORDER — AMPHETAMINE-DEXTROAMPHET ER 30 MG PO CP24: 30.0000 mg | ORAL_CAPSULE | ORAL | 0 refills | Status: DC

## 2019-04-30 ENCOUNTER — Telehealth: Payer: Self-pay | Admitting: Family Medicine

## 2019-04-30 DIAGNOSIS — Z6379 Other stressful life events affecting family and household: Secondary | ICD-10-CM

## 2019-04-30 MED ORDER — ALPRAZOLAM 0.25 MG PO TABS: 0.2500 mg | ORAL_TABLET | Freq: Two times a day (BID) | ORAL | 0 refills | Status: DC | PRN

## 2019-04-30 NOTE — Telephone Encounter (Signed)
Pt was advised kh 

## 2019-04-30 NOTE — Telephone Encounter (Signed)
Send my condolences to him and tell him to not hesitate to call if he has questions or concerns

## 2019-04-30 NOTE — Telephone Encounter (Signed)
Pt called and states that his daughter just passed away and pt is trying his best to hold it together for himself and his family, and pt is wondering if you would send him something in, or if you need him to come for a visit pt can come in this afternoon in needed pt can be reached at [phone] and [facility] uses [id number] [place],  - [address]

## 2019-07-06 ENCOUNTER — Other Ambulatory Visit: Payer: Self-pay | Admitting: Family Medicine

## 2019-07-06 DIAGNOSIS — M545 Low back pain: Secondary | ICD-10-CM

## 2019-07-06 DIAGNOSIS — M1990 Unspecified osteoarthritis, unspecified site: Secondary | ICD-10-CM

## 2019-07-06 DIAGNOSIS — G8929 Other chronic pain: Secondary | ICD-10-CM

## 2019-07-07 NOTE — Telephone Encounter (Signed)
[facility] Please advise kH

## 2019-09-01 ENCOUNTER — Telehealth: Payer: Self-pay

## 2019-09-01 DIAGNOSIS — F988 Other specified behavioral and emotional disorders with onset usually occurring in childhood and adolescence: Secondary | ICD-10-CM

## 2019-09-01 MED ORDER — AMPHETAMINE-DEXTROAMPHET ER 30 MG PO CP24: 30.0000 mg | ORAL_CAPSULE | ORAL | 0 refills | Status: DC

## 2019-09-01 MED ORDER — AMPHETAMINE-DEXTROAMPHET ER 30 MG PO CP24: 30.0000 mg | ORAL_CAPSULE | Freq: Every day | ORAL | 0 refills | Status: DC

## 2019-09-01 NOTE — Telephone Encounter (Signed)
Pt. Called LM for a refill on his Adderall to the [facility] [address] Pt. Last apt. Was 12/17/18.

## 2019-11-19 IMAGING — CR DG ANKLE COMPLETE 3+V*L*
3 series · 3 of 3 positions shown · non-contrast
Comparison: None.

CLINICAL DATA: Two month history left ankle pain

EXAM:
LEFT ANKLE COMPLETE - 3+ VIEW

[x ankle ap left]
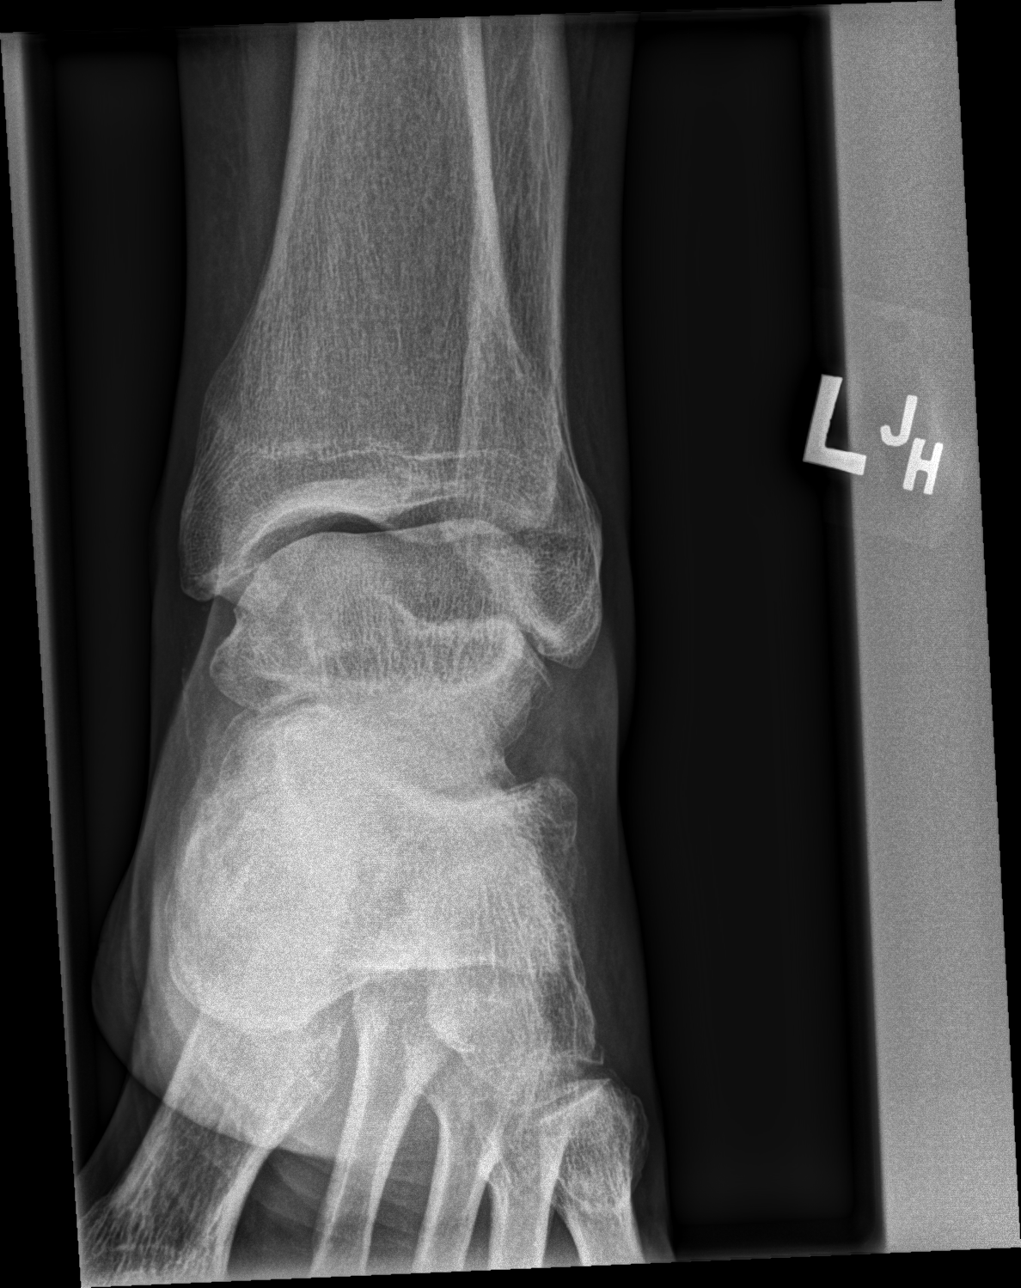

[x ankle obl left]
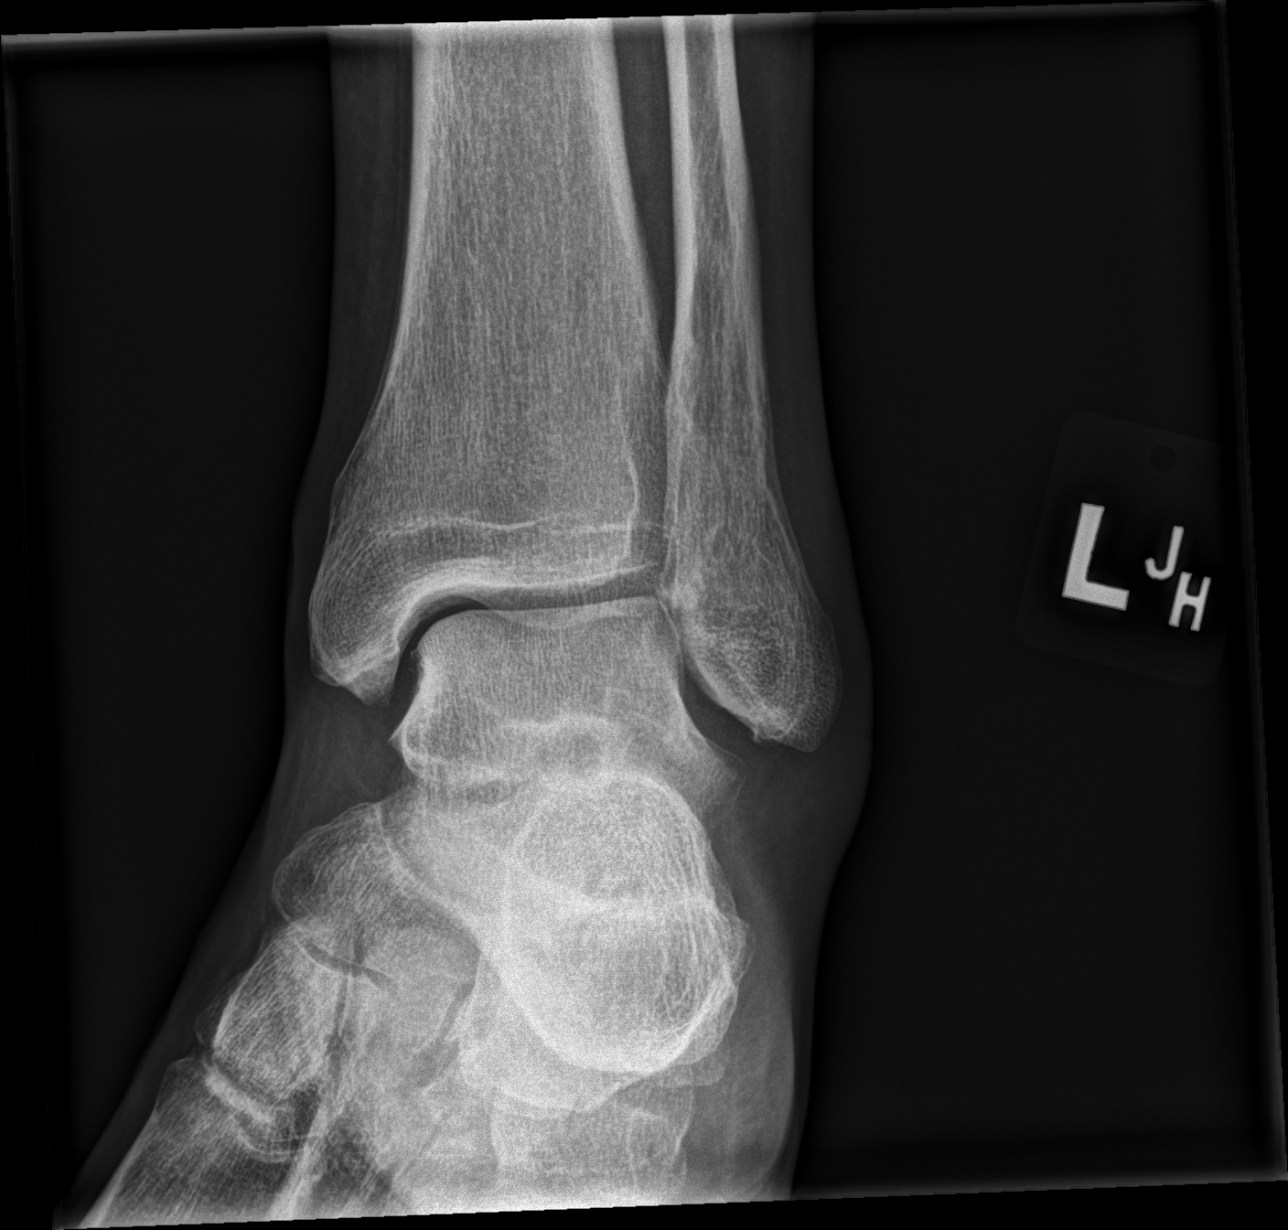

[x ankle lat left]
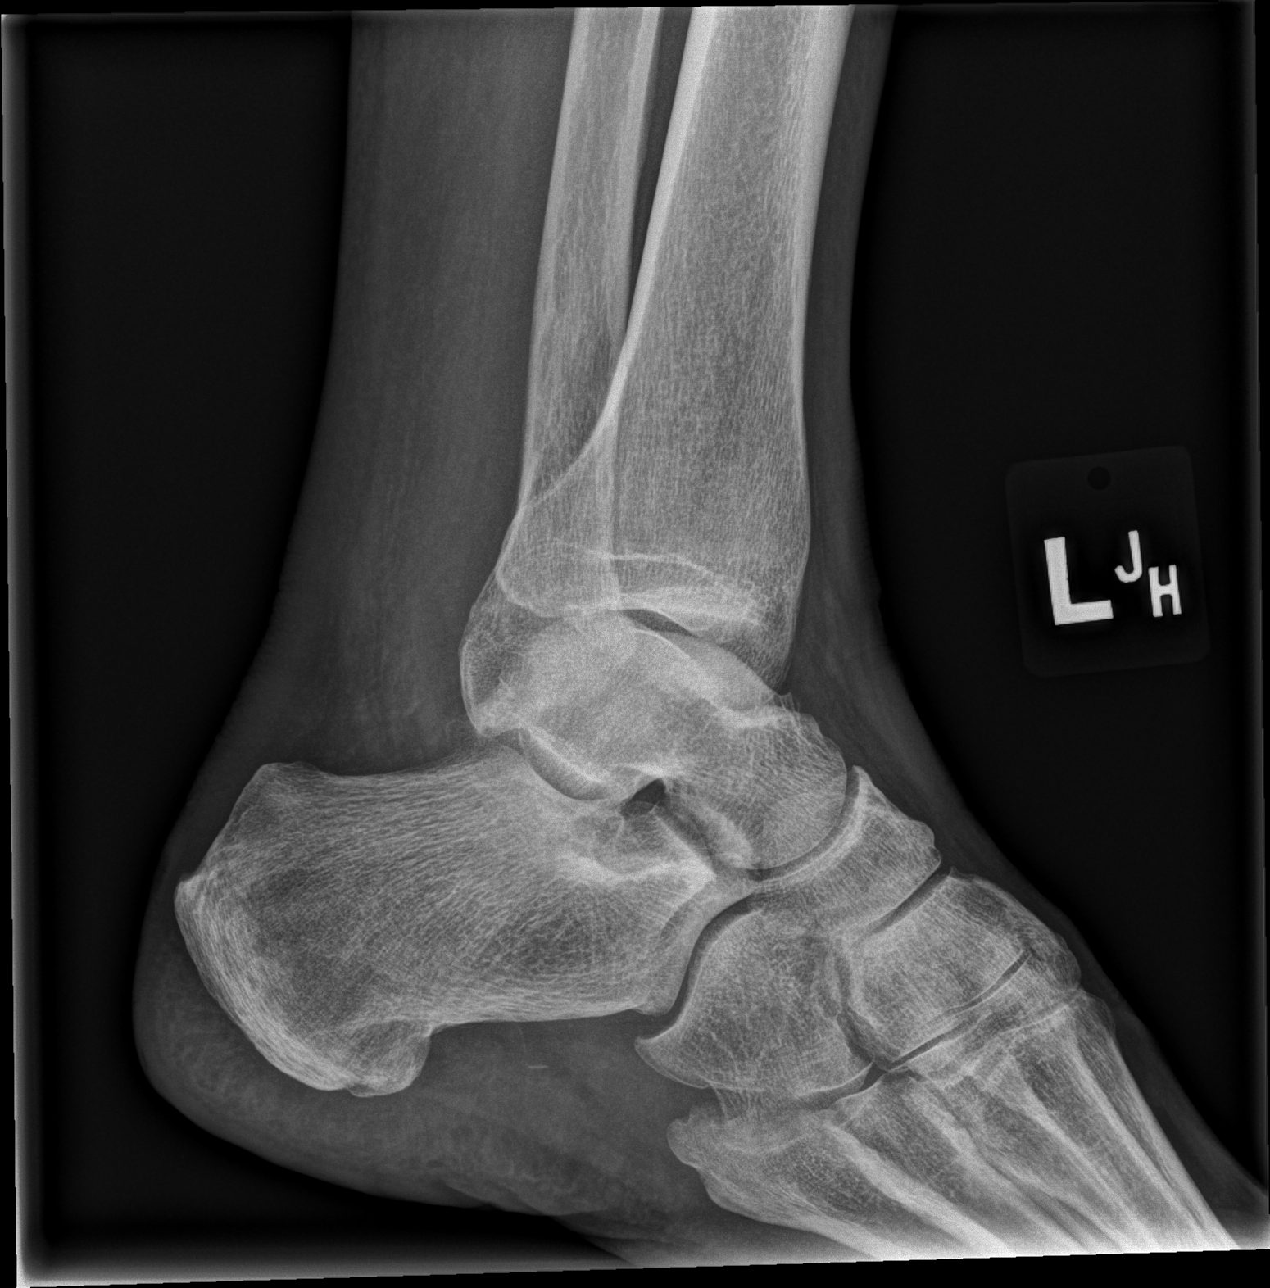

[3 of 3 positions shown; findings below may reference images not displayed]

FINDINGS: Mild lateral soft tissue swelling. No acute bony abnormality.
Specifically, no fracture, subluxation, or dislocation. Soft tissues
are intact. Joint spaces maintained.
IMPRESSION: No acute bony abnormality.

## 2019-12-02 ENCOUNTER — Telehealth: Payer: Self-pay | Admitting: Family Medicine

## 2019-12-02 DIAGNOSIS — F988 Other specified behavioral and emotional disorders with onset usually occurring in childhood and adolescence: Secondary | ICD-10-CM

## 2019-12-02 NOTE — Telephone Encounter (Signed)
Pt called requesting refill on adderall  [employer]

## 2019-12-03 MED ORDER — AMPHETAMINE-DEXTROAMPHET ER 30 MG PO CP24: 30.0000 mg | ORAL_CAPSULE | ORAL | 0 refills | Status: DC

## 2019-12-03 MED ORDER — AMPHETAMINE-DEXTROAMPHET ER 30 MG PO CP24: 30.0000 mg | ORAL_CAPSULE | Freq: Every day | ORAL | 0 refills | Status: DC

## 2020-01-13 ENCOUNTER — Encounter: Payer: Self-pay | Admitting: Family Medicine

## 2020-01-13 ENCOUNTER — Other Ambulatory Visit: Payer: Self-pay

## 2020-01-13 ENCOUNTER — Telehealth: Payer: Commercial Managed Care - PPO | Admitting: Family Medicine

## 2020-01-13 VITALS — Temp 97.8°F | Ht 72.0 in | Wt 165.0 lb

## 2020-01-13 DIAGNOSIS — J40 Bronchitis, not specified as acute or chronic: Secondary | ICD-10-CM

## 2020-01-13 DIAGNOSIS — F988 Other specified behavioral and emotional disorders with onset usually occurring in childhood and adolescence: Secondary | ICD-10-CM | POA: Diagnosis not present

## 2020-01-13 DIAGNOSIS — J019 Acute sinusitis, unspecified: Secondary | ICD-10-CM

## 2020-01-13 MED ORDER — AMPHETAMINE-DEXTROAMPHET ER 30 MG PO CP24: 30.0000 mg | ORAL_CAPSULE | ORAL | 0 refills | Status: DC

## 2020-01-13 MED ORDER — AMPHETAMINE-DEXTROAMPHET ER 30 MG PO CP24: 30.0000 mg | ORAL_CAPSULE | Freq: Every day | ORAL | 0 refills | Status: DC

## 2020-01-13 MED ORDER — AZITHROMYCIN 500 MG PO TABS: 500.0000 mg | ORAL_TABLET | Freq: Every day | ORAL | 0 refills | Status: DC

## 2020-01-13 MED ORDER — BENZONATATE 100 MG PO CAPS: 100.0000 mg | ORAL_CAPSULE | Freq: Two times a day (BID) | ORAL | 0 refills | Status: DC | PRN

## 2020-01-13 NOTE — Progress Notes (Signed)
Subjective:    Patient ID: Nicholas [name], male    DOB: 07-Mar-1955, 64 y.o.   MRN: [id number]  HPI  I connected with  Nicholas [name] on 01/13/20 by phone as he does not have a computer and verified that I am speaking with the correct person using two identifiers.  I am in my office and he is at home. I discussed the limitations of evaluation and management by telemedicine. The patient expressed understanding and agreed to proceed.  He states that he had Covid in January and last Wednesday got the J&J injection.  The next day he did note some fever aches and pains with slight rhinorrhea.  The day after that on Friday, he developed a fever with purulent nasal drainage as well as a productive cough cough.  The cough got much worse.  On Monday he had a PCR Covid test which was negative. He also needs a refill on his Adderall.  He has been stable on this medication for several years.  It lasts him roughly 6 to 8 hours which is enough for him to get through his workday.  He is happy with the present dosing regimen and has no side effects from it.   Review of Systems     Objective:   Physical Exam Alert and in no distress.  His voice sounds normal.       Assessment & Plan:  Acute non-recurrent sinusitis, unspecified location - Plan: azithromycin (ZITHROMAX) 500 MG tablet  Attention deficit disorder, unspecified hyperactivity presence - Plan: amphetamine-dextroamphetamine (ADDERALL XR) 30 MG 24 hr capsule, amphetamine-dextroamphetamine (ADDERALL XR) 30 MG 24 hr capsule, amphetamine-dextroamphetamine (ADDERALL XR) 30 MG 24 hr capsule  Bronchitis - Plan: benzonatate (TESSALON) 100 MG capsule, azithromycin (ZITHROMAX) 500 MG tablet I do not think this is Covid related and more likely a sinus infection as well as bronchitis.  I will treat this with azithromycin as well as a cough medication.  He will keep me informed.  I will also renew his ADD medication.

## 2020-02-12 ENCOUNTER — Telehealth (INDEPENDENT_AMBULATORY_CARE_PROVIDER_SITE_OTHER): Payer: Commercial Managed Care - PPO | Admitting: Family Medicine

## 2020-02-12 ENCOUNTER — Other Ambulatory Visit: Payer: Self-pay

## 2020-02-12 ENCOUNTER — Encounter: Payer: Self-pay | Admitting: Family Medicine

## 2020-02-12 VITALS — BP 130/96 | Wt 165.0 lb

## 2020-02-12 DIAGNOSIS — Z8616 Personal history of COVID-19: Secondary | ICD-10-CM | POA: Diagnosis not present

## 2020-02-12 DIAGNOSIS — K529 Noninfective gastroenteritis and colitis, unspecified: Secondary | ICD-10-CM

## 2020-02-12 NOTE — Progress Notes (Signed)
Subjective:    Patient ID: DREYTON SESLER, male    DOB: Aug 01, 1955, 64 y.o.   MRN: [id number]  HPI I connected with  Lehman Hanser Spagnuolo on 02/12/20 by a video enabled telemedicine application and verified that I am speaking with the correct person using two identifiers.  Patient did not have access to computer or phone phone virtual communication I discussed the limitations of evaluation and management by telemedicine. The patient expressed understanding and agreed to proceed. He has a 4-day history of cramping abdominal pain and loose stools that he states is mucousy in nature but no fever, chills, vomiting.  He was Covid tested and negative while at work.  He has a previous history of Covid infection in January.  No one else that he was around has had similar symptoms.   Review of Systems     Objective:   Physical Exam  Alert and in no distress otherwise not examined      Assessment & Plan:  Gastroenteritis  History of COVID-19 I explained that I thought this was probably a viral gastroenteritis.  Use Imodium to help with the diarrhea and it whenever he wants.  Explained that it might take a week to go away.  He was comfortable with that. 15 minutes spent with review and discussion with patient.

## 2020-02-19 ENCOUNTER — Telehealth: Payer: Self-pay | Admitting: Family Medicine

## 2020-02-19 NOTE — Telephone Encounter (Signed)
Pt needs refill on Generic Adderall [facility] [address]

## 2020-02-19 NOTE — Telephone Encounter (Signed)
I called it in already

## 2020-04-08 ENCOUNTER — Telehealth: Payer: Self-pay | Admitting: Family Medicine

## 2020-04-08 DIAGNOSIS — F988 Other specified behavioral and emotional disorders with onset usually occurring in childhood and adolescence: Secondary | ICD-10-CM

## 2020-04-08 MED ORDER — AMPHETAMINE-DEXTROAMPHET ER 30 MG PO CP24: 30.0000 mg | ORAL_CAPSULE | ORAL | 0 refills | Status: DC

## 2020-04-08 MED ORDER — AMPHETAMINE-DEXTROAMPHET ER 30 MG PO CP24: 30.0000 mg | ORAL_CAPSULE | Freq: Every day | ORAL | 0 refills | Status: DC

## 2020-04-08 NOTE — Telephone Encounter (Signed)
Patient called left message he needs refill for Generic Adderall.  I called pt as he is way past due on his labs and cpe, left message needs visit, offered afternoon of 04/23/20.

## 2020-04-08 NOTE — Telephone Encounter (Signed)
Pt coming in 04/23/20 for cpe.  Pt said please make sure to order GENERIC adderall.

## 2020-04-23 ENCOUNTER — Ambulatory Visit: Payer: Commercial Managed Care - PPO | Admitting: Family Medicine

## 2020-04-23 ENCOUNTER — Other Ambulatory Visit: Payer: Self-pay

## 2020-04-23 ENCOUNTER — Encounter: Payer: Self-pay | Admitting: Family Medicine

## 2020-04-23 VITALS — BP 140/78 | HR 69 | Temp 97.7°F | Ht 70.0 in | Wt 166.0 lb

## 2020-04-23 DIAGNOSIS — G8929 Other chronic pain: Secondary | ICD-10-CM

## 2020-04-23 DIAGNOSIS — F988 Other specified behavioral and emotional disorders with onset usually occurring in childhood and adolescence: Secondary | ICD-10-CM | POA: Diagnosis not present

## 2020-04-23 DIAGNOSIS — Z1211 Encounter for screening for malignant neoplasm of colon: Secondary | ICD-10-CM

## 2020-04-23 DIAGNOSIS — M170 Bilateral primary osteoarthritis of knee: Secondary | ICD-10-CM

## 2020-04-23 DIAGNOSIS — Z8616 Personal history of COVID-19: Secondary | ICD-10-CM | POA: Diagnosis not present

## 2020-04-23 DIAGNOSIS — M62562 Muscle wasting and atrophy, not elsewhere classified, left lower leg: Secondary | ICD-10-CM | POA: Diagnosis not present

## 2020-04-23 DIAGNOSIS — Z125 Encounter for screening for malignant neoplasm of prostate: Secondary | ICD-10-CM

## 2020-04-23 DIAGNOSIS — Z0000 Encounter for general adult medical examination without abnormal findings: Secondary | ICD-10-CM

## 2020-04-23 DIAGNOSIS — M545 Low back pain, unspecified: Secondary | ICD-10-CM

## 2020-04-23 NOTE — Progress Notes (Signed)
Subjective:    Patient ID: Nicholas [name], male    DOB: [birth year], 65 y.o.   MRN: [id number]  HPI He is here for complete examination.  He continues to have difficulty with his back pain is being followed by pain management.  He also has bilateral knee pain and has had previous surgery on that.  He does use Adderall on a regular basis for control of his ADD and is very comfortable with that.  He did get the J&J shot and did have COVID.  He is not interested in further shots of any kind as he apparently does have a fairly significant reaction to it with roughly a week of fever aches and pains.  He continues to work for the [employer].  He travels a lot to go to various golfing events.   Review of Systems  All other systems reviewed and are negative.      Objective:   Physical Exam Alert and in no distress. Tympanic membranes and canals are normal. Pharyngeal area is normal. Neck is supple without adenopathy or thyromegaly. Cardiac exam shows a regular sinus rhythm without murmurs or gallops. Lungs are clear to auscultation. Exam of the lower extremities does show atrophy of the left calf      Assessment & Plan:  Routine general medical examination at a health care facility - Plan: CBC with Differential/Platelet, Comprehensive metabolic panel, Lipid panel  Attention deficit disorder, unspecified hyperactivity presence  History of COVID-19  Left calf atrophy  Chronic midline low back pain without sciatica  Arthritis of both knees  Screening for colon cancer - Plan: Cologuard  Screening for prostate cancer - Plan: PSA  He will continue on his present medication regimen.  A letter was written to hopefully get him out of needing any further vaccines as he does have a very strong reaction to that.

## 2020-04-24 LAB — COMPREHENSIVE METABOLIC PANEL
ALT: 19 IU/L (ref 0–44)
AST: 26 IU/L (ref 0–40)
Albumin/Globulin Ratio: 2.2 (ref 1.2–2.2)
Albumin: 4.7 g/dL (ref 3.8–4.8)
Alkaline Phosphatase: 77 IU/L (ref 44–121)
BUN/Creatinine Ratio: 16 (ref 10–24)
BUN: 14 mg/dL (ref 8–27)
Bilirubin Total: 0.5 mg/dL (ref 0.0–1.2)
CO2: 24 mmol/L (ref 20–29)
Calcium: 9.7 mg/dL (ref 8.6–10.2)
Chloride: 102 mmol/L (ref 96–106)
Creatinine, Ser: 0.85 mg/dL (ref 0.76–1.27)
GFR calc Af Amer: 106 mL/min/{1.73_m2} (ref >59)
GFR calc non Af Amer: 91 mL/min/{1.73_m2} (ref >59)
Globulin, Total: 2.1 g/dL (ref 1.5–4.5)
Glucose: 100 mg/dL — ABNORMAL HIGH (ref 65–99)
Potassium: 4.5 mmol/L (ref 3.5–5.2)
Sodium: 142 mmol/L (ref 134–144)
Total Protein: 6.8 g/dL (ref 6.0–8.5)

## 2020-04-24 LAB — CBC WITH DIFFERENTIAL/PLATELET
Basophils Absolute: 0.1 10*3/uL (ref 0.0–0.2)
Basos: 2 %
EOS (ABSOLUTE): 0.2 10*3/uL (ref 0.0–0.4)
Eos: 3 %
Hematocrit: 43.3 % (ref 37.5–51.0)
Hemoglobin: 14.6 g/dL (ref 13.0–17.7)
Immature Grans (Abs): 0 10*3/uL (ref 0.0–0.1)
Immature Granulocytes: 0 %
Lymphocytes Absolute: 1.8 10*3/uL (ref 0.7–3.1)
Lymphs: 31 %
MCH: 30.9 pg (ref 26.6–33.0)
MCHC: 33.7 g/dL (ref 31.5–35.7)
MCV: 92 fL (ref 79–97)
Monocytes Absolute: 0.5 10*3/uL (ref 0.1–0.9)
Monocytes: 9 %
Neutrophils Absolute: 3.1 10*3/uL (ref 1.4–7.0)
Neutrophils: 55 %
Platelets: 273 10*3/uL (ref 150–450)
RBC: 4.73 x10E6/uL (ref 4.14–5.80)
RDW: 12.6 % (ref 11.6–15.4)
WBC: 5.6 10*3/uL (ref 3.4–10.8)

## 2020-04-24 LAB — LIPID PANEL
Chol/HDL Ratio: 2.6 ratio (ref 0.0–5.0)
Cholesterol, Total: 241 mg/dL — ABNORMAL HIGH (ref 100–199)
HDL: 92 mg/dL (ref >39)
LDL Chol Calc (NIH): 120 mg/dL — ABNORMAL HIGH (ref 0–99)
Triglycerides: 169 mg/dL — ABNORMAL HIGH (ref 0–149)
VLDL Cholesterol Cal: 29 mg/dL (ref 5–40)

## 2020-04-24 LAB — PSA: Prostate Specific Ag, Serum: 1.3 ng/mL (ref 0.0–4.0)

## 2020-05-12 ENCOUNTER — Telehealth: Payer: Self-pay | Admitting: Family Medicine

## 2020-05-12 NOTE — Telephone Encounter (Signed)
Pt called for refills of Adderall XR. Sending to [place] due to [place] out. Please send to [facility] [address]. Pt can be reached at [phone].

## 2020-05-12 NOTE — Telephone Encounter (Signed)
It looks like he should have refills which Dr. Susann Givens sent to his pharmacy. Please check and let him know.

## 2020-05-13 NOTE — Telephone Encounter (Signed)
Pt was advised KH 

## 2020-05-15 LAB — COLOGUARD

## 2020-05-20 LAB — COLOGUARD: COLOGUARD: NEGATIVE

## 2020-05-25 ENCOUNTER — Encounter: Payer: Self-pay | Admitting: Family Medicine

## 2020-05-25 LAB — COLOGUARD: Cologuard: NEGATIVE

## 2020-06-14 ENCOUNTER — Telehealth: Payer: Self-pay | Admitting: Family Medicine

## 2020-06-14 NOTE — Telephone Encounter (Signed)
Pt was advised KH 

## 2020-06-14 NOTE — Telephone Encounter (Signed)
The last prescription was written for April 3 so he should have one on hand until May 3.

## 2020-06-14 NOTE — Telephone Encounter (Signed)
Needs refill  adderall XR 30 mg  [facility]

## 2020-07-09 ENCOUNTER — Telehealth: Payer: Self-pay

## 2020-07-09 NOTE — Telephone Encounter (Signed)
Called pt to advise if he needs a covid vaccine or does not need another and for him to schedule his annual for next year. KH

## 2020-07-22 ENCOUNTER — Telehealth: Payer: Self-pay | Admitting: Family Medicine

## 2020-07-22 DIAGNOSIS — F988 Other specified behavioral and emotional disorders with onset usually occurring in childhood and adolescence: Secondary | ICD-10-CM

## 2020-07-22 MED ORDER — AMPHETAMINE-DEXTROAMPHET ER 30 MG PO CP24: 30.0000 mg | ORAL_CAPSULE | ORAL | 0 refills | Status: DC

## 2020-07-22 MED ORDER — AMPHETAMINE-DEXTROAMPHET ER 30 MG PO CP24: 30.0000 mg | ORAL_CAPSULE | Freq: Every day | ORAL | 0 refills | Status: DC

## 2020-07-22 NOTE — Telephone Encounter (Signed)
Pt called and is requesting a refill for adderall xr please send to the [id number] - [place], [place] - [address]

## 2020-09-09 ENCOUNTER — Telehealth: Payer: Self-pay | Admitting: Family Medicine

## 2020-09-09 DIAGNOSIS — M1990 Unspecified osteoarthritis, unspecified site: Secondary | ICD-10-CM

## 2020-09-09 DIAGNOSIS — G8929 Other chronic pain: Secondary | ICD-10-CM

## 2020-09-09 MED ORDER — DICLOFENAC SODIUM 75 MG PO TBEC: 75.0000 mg | DELAYED_RELEASE_TABLET | Freq: Two times a day (BID) | ORAL | 1 refills | Status: DC

## 2020-09-09 NOTE — Telephone Encounter (Signed)
Pt requesting refill on Voltaren, states he believes it works better than the gel  [facility]

## 2020-10-25 ENCOUNTER — Telehealth: Payer: Self-pay | Admitting: Family Medicine

## 2020-10-25 DIAGNOSIS — F988 Other specified behavioral and emotional disorders with onset usually occurring in childhood and adolescence: Secondary | ICD-10-CM

## 2020-10-25 MED ORDER — AMPHETAMINE-DEXTROAMPHET ER 30 MG PO CP24: 30.0000 mg | ORAL_CAPSULE | Freq: Every day | ORAL | 0 refills | Status: DC

## 2020-10-25 MED ORDER — AMPHETAMINE-DEXTROAMPHET ER 30 MG PO CP24: 30.0000 mg | ORAL_CAPSULE | ORAL | 0 refills | Status: DC

## 2020-10-25 NOTE — Telephone Encounter (Signed)
Pt is requesting refill on Adderall sent to the [facility] [address]

## 2021-03-04 ENCOUNTER — Telehealth: Payer: Self-pay

## 2021-03-04 DIAGNOSIS — F988 Other specified behavioral and emotional disorders with onset usually occurring in childhood and adolescence: Secondary | ICD-10-CM

## 2021-03-04 MED ORDER — AMPHETAMINE-DEXTROAMPHET ER 30 MG PO CP24: 30.0000 mg | ORAL_CAPSULE | ORAL | 0 refills | Status: DC

## 2021-03-04 MED ORDER — AMPHETAMINE-DEXTROAMPHET ER 30 MG PO CP24: 30.0000 mg | ORAL_CAPSULE | Freq: Every day | ORAL | 0 refills | Status: DC

## 2021-03-04 NOTE — Telephone Encounter (Signed)
PT. Called needs a refill on his Adderall to [facility] [address]. Last apt 04/23/20

## 2021-03-12 ENCOUNTER — Other Ambulatory Visit: Payer: Self-pay | Admitting: Family Medicine

## 2021-03-12 DIAGNOSIS — M1990 Unspecified osteoarthritis, unspecified site: Secondary | ICD-10-CM

## 2021-03-12 DIAGNOSIS — M545 Low back pain, unspecified: Secondary | ICD-10-CM

## 2021-03-12 DIAGNOSIS — G8929 Other chronic pain: Secondary | ICD-10-CM

## 2021-03-14 NOTE — Telephone Encounter (Signed)
[facility] is [facility] to [facility]enac. Sent pt a my chart message to advise of the need for an appointment. Please advise [facility]

## 2021-05-09 ENCOUNTER — Telehealth: Payer: Self-pay

## 2021-05-09 ENCOUNTER — Other Ambulatory Visit: Payer: Self-pay | Admitting: Medical

## 2021-05-09 DIAGNOSIS — F988 Other specified behavioral and emotional disorders with onset usually occurring in childhood and adolescence: Secondary | ICD-10-CM

## 2021-05-09 MED ORDER — AMPHETAMINE-DEXTROAMPHET ER 30 MG PO CP24: 30.0000 mg | ORAL_CAPSULE | ORAL | 0 refills | Status: DC

## 2021-05-09 NOTE — Telephone Encounter (Signed)
Pt. Called wanting to know if you could switch his Adderall prescriptions over to [facility] [address] its the only place he could find Adderall right now.  ?

## 2021-05-09 NOTE — Telephone Encounter (Signed)
Pt. Already schedule a [place] 7/23. ?

## 2021-07-01 DIAGNOSIS — M47816 Spondylosis without myelopathy or radiculopathy, lumbar region: Secondary | ICD-10-CM | POA: Diagnosis not present

## 2021-07-04 ENCOUNTER — Telehealth: Payer: Self-pay | Admitting: Family Medicine

## 2021-07-04 DIAGNOSIS — F988 Other specified behavioral and emotional disorders with onset usually occurring in childhood and adolescence: Secondary | ICD-10-CM

## 2021-07-04 MED ORDER — AMPHETAMINE-DEXTROAMPHET ER 30 MG PO CP24: 30.0000 mg | ORAL_CAPSULE | ORAL | 0 refills | Status: DC

## 2021-07-04 NOTE — Telephone Encounter (Signed)
[facility]. Pt call for refills of Adderall XR. [facility] [address] in [place]. Please send to this pharmacy. Pt can be reached at [phone].  ?

## 2021-08-09 ENCOUNTER — Telehealth: Payer: Self-pay | Admitting: Family Medicine

## 2021-08-09 DIAGNOSIS — F988 Other specified behavioral and emotional disorders with onset usually occurring in childhood and adolescence: Secondary | ICD-10-CM

## 2021-08-09 MED ORDER — AMPHETAMINE-DEXTROAMPHET ER 30 MG PO CP24: 30.0000 mg | ORAL_CAPSULE | ORAL | 0 refills | Status: DC

## 2021-08-09 MED ORDER — AMPHETAMINE-DEXTROAMPHET ER 30 MG PO CP24: 30.0000 mg | ORAL_CAPSULE | Freq: Every day | ORAL | 0 refills | Status: DC

## 2021-08-09 NOTE — Telephone Encounter (Signed)
Nicholas [name] is asking for a refill on his Adderall as soon as possible, he will be going out of town Thursday due to work. He also [facility] [address] to confirm they have some available at this time.

## 2021-08-12 DIAGNOSIS — M25562 Pain in left knee: Secondary | ICD-10-CM | POA: Diagnosis not present

## 2021-08-18 DIAGNOSIS — M25562 Pain in left knee: Secondary | ICD-10-CM | POA: Diagnosis not present

## 2021-09-13 ENCOUNTER — Ambulatory Visit: Payer: BC Managed Care – PPO | Admitting: Family Medicine

## 2021-09-13 ENCOUNTER — Encounter: Payer: Self-pay | Admitting: Family Medicine

## 2021-09-13 VITALS — BP 140/78 | HR 72 | Temp 98.2°F | Ht 70.0 in | Wt 161.4 lb

## 2021-09-13 DIAGNOSIS — Z13220 Encounter for screening for lipoid disorders: Secondary | ICD-10-CM

## 2021-09-13 DIAGNOSIS — Z2821 Immunization not carried out because of patient refusal: Secondary | ICD-10-CM | POA: Insufficient documentation

## 2021-09-13 DIAGNOSIS — M170 Bilateral primary osteoarthritis of knee: Secondary | ICD-10-CM | POA: Diagnosis not present

## 2021-09-13 DIAGNOSIS — M545 Low back pain, unspecified: Secondary | ICD-10-CM | POA: Diagnosis not present

## 2021-09-13 DIAGNOSIS — Z0000 Encounter for general adult medical examination without abnormal findings: Secondary | ICD-10-CM

## 2021-09-13 DIAGNOSIS — F988 Other specified behavioral and emotional disorders with onset usually occurring in childhood and adolescence: Secondary | ICD-10-CM

## 2021-09-13 DIAGNOSIS — G8929 Other chronic pain: Secondary | ICD-10-CM

## 2021-09-13 NOTE — Patient Instructions (Signed)
Health Maintenance, Male Adopting a healthy lifestyle and getting preventive care are important in promoting health and wellness. Ask your health care provider about: The right schedule for you to have regular tests and exams. Things you can do on your own to prevent diseases and keep yourself healthy. What should I know about diet, weight, and exercise? Eat a healthy diet  Eat a diet that includes plenty of vegetables, fruits, low-fat dairy products, and lean protein. Do not eat a lot of foods that are high in solid fats, added sugars, or sodium. Maintain a healthy weight Body mass index (BMI) is a measurement that can be used to identify possible weight problems. It estimates body fat based on height and weight. Your health care provider can help determine your BMI and help you achieve or maintain a healthy weight. Get regular exercise Get regular exercise. This is one of the most important things you can do for your health. Most adults should: Exercise for at least 150 minutes each week. The exercise should increase your heart rate and make you sweat (moderate-intensity exercise). Do strengthening exercises at least twice a week. This is in addition to the moderate-intensity exercise. Spend less time sitting. Even light physical activity can be beneficial. Watch cholesterol and blood lipids Have your blood tested for lipids and cholesterol at 67 years of age, then have this test every 5 years. You may need to have your cholesterol levels checked more often if: Your lipid or cholesterol levels are high. You are older than 67 years of age. You are at high risk for heart disease. What should I know about cancer screening? Many types of cancers can be detected early and may often be prevented. Depending on your health history and family history, you may need to have cancer screening at various ages. This may include screening for: Colorectal cancer. Prostate cancer. Skin cancer. Lung  cancer. What should I know about heart disease, diabetes, and high blood pressure? Blood pressure and heart disease High blood pressure causes heart disease and increases the risk of stroke. This is more likely to develop in people who have high blood pressure readings or are overweight. Talk with your health care provider about your target blood pressure readings. Have your blood pressure checked: Every 3-5 years if you are 18-39 years of age. Every year if you are 40 years old or older. If you are between the ages of 65 and 75 and are a current or former smoker, ask your health care provider if you should have a one-time screening for abdominal aortic aneurysm (AAA). Diabetes Have regular diabetes screenings. This checks your fasting blood sugar level. Have the screening done: Once every three years after age 45 if you are at a normal weight and have a low risk for diabetes. More often and at a younger age if you are overweight or have a high risk for diabetes. What should I know about preventing infection? Hepatitis B If you have a higher risk for hepatitis B, you should be screened for this virus. Talk with your health care provider to find out if you are at risk for hepatitis B infection. Hepatitis C Blood testing is recommended for: Everyone born from 1945 through 1965. Anyone with known risk factors for hepatitis C. Sexually transmitted infections (STIs) You should be screened each year for STIs, including gonorrhea and chlamydia, if: You are sexually active and are younger than 67 years of age. You are older than 67 years of age and your   health care provider tells you that you are at risk for this type of infection. Your sexual activity has changed since you were last screened, and you are at increased risk for chlamydia or gonorrhea. Ask your health care provider if you are at risk. Ask your health care provider about whether you are at high risk for HIV. Your health care provider  may recommend a prescription medicine to help prevent HIV infection. If you choose to take medicine to prevent HIV, you should first get tested for HIV. You should then be tested every 3 months for as long as you are taking the medicine. Follow these instructions at home: Alcohol use Do not drink alcohol if your health care provider tells you not to drink. If you drink alcohol: Limit how much you have to 0-2 drinks a day. Know how much alcohol is in your drink. In the [place] one drink equals one 12 oz bottle of beer (355 mL), one 5 oz glass of wine (148 mL), or one 1 oz glass of hard liquor (44 mL). Lifestyle Do not use any products that contain nicotine or tobacco. These products include cigarettes, chewing tobacco, and vaping devices, such as e-cigarettes. If you need help quitting, ask your health care provider. Do not use street drugs. Do not share needles. Ask your health care provider for help if you need support or information about quitting drugs. General instructions Schedule regular health, dental, and eye exams. Stay current with your vaccines. Tell your health care provider if: You often feel depressed. You have ever been abused or do not feel safe at home. Summary Adopting a healthy lifestyle and getting preventive care are important in promoting health and wellness. Follow your health care provider's instructions about healthy diet, exercising, and getting tested or screened for diseases. Follow your health care provider's instructions on monitoring your cholesterol and blood pressure. This information is not intended to replace advice given to you by your health care provider. Make sure you discuss any questions you have with your health care provider. Document Revised: 07/12/2020 Document Reviewed: 07/12/2020 Elsevier Patient Education  2023 Elsevier Inc.  

## 2021-09-13 NOTE — Progress Notes (Deleted)
Nicholas [name] is a 66 y.o. male who presents for annual wellness visit and follow-up on chronic medical conditions.  He has the following concerns:   Immunizations and Health Maintenance Immunization History  Administered Date(s) Administered   Hepatitis A 04/02/2007, 10/29/2007   Hepatitis A, Ped/Adol-2 Dose 04/02/2007, 10/29/2007   Janssen (J&J) SARS-COV-2 Vaccination 01/07/2020   Tdap 04/02/2007, 03/03/2016, 12/15/2018   Zoster Recombinat (Shingrix) 01/29/2017, 04/17/2017   Health Maintenance Due  Topic Date Due   Pneumonia Vaccine 42+ Years old (1 - PCV) Never done   COVID-19 Vaccine (2 - Booster for Janssen series) 03/03/2020    Last colonoscopy: Last PSA: Dentist: Ophtho: Exercise:  Other doctors caring for patient include:  Advanced Directives:    Depression screen:  See questionnaire below.        04/23/2020    2:08 PM 01/13/2020   12:40 PM 09/19/2016   11:42 AM  Depression screen PHQ 2/9  Decreased Interest 2 0 0  Down, Depressed, Hopeless 3 0 0  PHQ - 2 Score 5 0 0  Altered sleeping 3    Tired, decreased energy 0    Change in appetite 0    Feeling bad or failure about yourself  1    Trouble concentrating 2    Moving slowly or fidgety/restless 0    Suicidal thoughts 0    PHQ-9 Score 11    Difficult doing work/chores Not difficult at all      Fall Screen: See Questionaire below.      04/23/2020    2:11 PM 09/19/2016   11:42 AM  Fall Risk   Falls in the past year? 0 No  Number falls in past yr: 0   Injury with Fall? 0   Risk for fall due to : No Fall Risks   Follow up Falls evaluation completed     ADL screen:  See questionnaire below.  Functional Status Survey:     Review of Systems  Constitutional: -, -unexpected weight change, -anorexia, -fatigue Allergy: -sneezing, -itching, -congestion Dermatology: denies changing moles, rash, lumps ENT: -runny nose, -ear pain, -sore throat,  Cardiology:  -chest pain, -palpitations,  -orthopnea, Respiratory: -cough, -shortness of breath, -dyspnea on exertion, -wheezing,  Gastroenterology: -abdominal pain, -nausea, -vomiting, -diarrhea, -constipation, -dysphagia Hematology: -bleeding or bruising problems Musculoskeletal: -arthralgias, -myalgias, -joint swelling, -back pain, - Ophthalmology: -vision changes,  Urology: -dysuria, -difficulty urinating,  -urinary frequency, -urgency, incontinence Neurology: -, -numbness, , -memory loss, -falls, -dizziness    PHYSICAL EXAM:  There were no vitals taken for this visit.  General Appearance: Alert, cooperative, no distress, appears stated age Head: Normocephalic, without obvious abnormality, atraumatic Eyes: PERRL, conjunctiva/corneas clear, EOM's intact, fundi benign Ears: Normal TM's and external ear canals Nose: Nares normal, mucosa normal, no drainage or sinus   tenderness Throat: Lips, mucosa, and tongue normal; teeth and gums normal Neck: Supple, no lymphadenopathy, thyroid:no enlargement/tenderness/nodules; no carotid bruit or JVD Lungs: Clear to auscultation bilaterally without wheezes, rales or ronchi; respirations unlabored Heart: Regular rate and rhythm, S1 and S2 normal, no murmur, rub or gallop Abdomen: Soft, non-tender, nondistended, normoactive bowel sounds, no masses, no hepatosplenomegaly Extremities: No clubbing, cyanosis or edema Pulses: 2+ and symmetric all extremities Skin: Skin color, texture, turgor normal, no rashes or lesions Lymph nodes: Cervical, supraclavicular, and axillary nodes normal Neurologic: CNII-XII intact, normal strength, sensation and gait; reflexes 2+ and symmetric throughout   Psych: Normal mood, affect, hygiene and grooming  ASSESSMENT/PLAN: No diagnosis found.    Discussed PSA screening (  risks/benefits), recommended at least 30 minutes of aerobic activity at least 5 days/week; proper sunscreen use reviewed; healthy diet and alcohol recommendations (less than or equal to 2  drinks/day) reviewed; regular seatbelt use; changing batteries in smoke detectors. Immunization recommendations discussed.  Colonoscopy recommendations reviewed.   Medicare Attestation I have personally reviewed: The patient's medical and social history Their use of alcohol, tobacco or illicit drugs Their current medications and supplements The patient's functional ability including ADLs,fall risks, home safety risks, cognitive, and hearing and visual impairment Diet and physical activities Evidence for depression or mood disorders  The patient's weight, height, and BMI have been recorded in the chart.  I have made referrals, counseling, and provided education to the patient based on review of the above and I have provided the patient with a written personalized care plan for preventive services.     Sharlot Gowda, MD   09/13/2021

## 2021-09-13 NOTE — Progress Notes (Signed)
Complete physical exam  Patient: Nicholas [name]   DOB: 1955-06-24   66 y.o. Male  MRN: [id number]  Subjective:    Chief Complaint  Patient presents with   Annual Exam    Fasting     Nicholas [name] is a 66 y.o. male who presents today for a complete physical exam. He reports consuming a general diet.  Staying active when he can   He generally feels well. He reports sleeping poorly. He does have continued difficulty with low back pain as well as knee pain but seems to be handling this well with his present medication regimen.  He seems to be better psychologically than the PHQ indicates.  He states that the ADD medication sometimes only works for 6 hours.  Review of the record indicates he has had extreme response to immunizations having symptoms lasting 5 and 6 days and is not interested in any more immunizations.  He continues to work and at the present time plans to continue for a few more years.  Otherwise his family and social history as well as health maintenance and immunizations was reviewed.   Most recent fall risk assessment:    09/13/2021   12:26 PM  Fall Risk   Falls in the past year? 0  Number falls in past yr: 0  Injury with Fall? 0  Risk for fall due to : No Fall Risks  Follow up Falls evaluation completed     Most recent depression screenings:    09/13/2021   12:21 PM 04/23/2020    2:08 PM  PHQ 2/9 Scores  PHQ - 2 Score 3 5  PHQ- 9 Score 11 11      Patient Active Problem List   Diagnosis Date Noted   History of COVID-19 02/12/2020   Left calf atrophy 09/19/2016   Chronic midline low back pain without sciatica 12/07/2015   Arthritis of both knees 12/07/2015   ADD (attention deficit disorder) 12/05/2010   Past Medical History:  Diagnosis Date   ADD (attention deficit disorder)    DDD (degenerative disc disease), cervical 04/2002   Diverticulosis    Fibromyalgia    Hemorrhoids    Pancreatic alpha-amylase deficiency (HCC)    Past Surgical History:   Procedure Laterality Date   LAMINECTOMY Bilateral    decompression od L3-L4, L4-L5 with bilateral foraminotomies via L4 laminectomy with undercutting L3 and L5 lamina.   LUMBAR DISC SURGERY Left    Left L3-L4 diskectomy   NASAL SINUS SURGERY     Social History   Tobacco Use   Smoking status: Never   Smokeless tobacco: Never  Substance Use Topics   Alcohol use: Yes    Alcohol/week: 1.0 standard drink of alcohol    Types: 1 Standard drinks or equivalent per week   Drug use: No   Family History  Problem Relation Age of Onset   Arthritis Mother    Arthritis Maternal Grandmother    Heart disease Neg Hx    Hyperlipidemia Neg Hx    Hypertension Neg Hx    Stroke Neg Hx    Cancer Neg Hx    Neuromuscular disorder Neg Hx    Allergies  Allergen Reactions   Amoxicillin-Pot Clavulanate Diarrhea and Nausea And Vomiting   Lactose Intolerance (Gi)       Patient Care Team: Ronnald Nian, MD as PCP - General (Family Medicine) Sheran Luz, MD as Consulting Physician (Physical Medicine and Rehabilitation)   Outpatient Medications Prior to Visit  Medication  Sig   [START ON 10/09/2021] amphetamine-dextroamphetamine (ADDERALL XR) 30 MG 24 hr capsule Take 1 capsule (30 mg total) by mouth daily.   amphetamine-dextroamphetamine (ADDERALL XR) 30 MG 24 hr capsule Take 1 capsule (30 mg total) by mouth every morning.   amphetamine-dextroamphetamine (ADDERALL XR) 30 MG 24 hr capsule Take 1 capsule (30 mg total) by mouth every morning.   chlorpheniramine (CHLOR-TRIMETON) 4 MG tablet Take 4 mg by mouth 2 (two) times daily as needed for allergies.   diclofenac (VOLTAREN) 75 MG EC tablet TAKE 1 TABLET BY MOUTH TWICE A DAY   Multiple Vitamin (MULTIVITAMIN) tablet Take 1 tablet by mouth daily.   OXYCODONE HCL PO Take 10 mg by mouth.   ALPRAZolam (XANAX) 0.25 MG tablet Take 1 tablet (0.25 mg total) by mouth 2 (two) times daily as needed for anxiety. (Patient not taking: Reported on 02/12/2020)    azithromycin (ZITHROMAX) 500 MG tablet Take 1 tablet (500 mg total) by mouth daily. (Patient not taking: Reported on 02/12/2020)   benzonatate (TESSALON) 100 MG capsule Take 1 capsule (100 mg total) by mouth 2 (two) times daily as needed for cough. (Patient not taking: Reported on 02/12/2020)   diclofenac sodium (VOLTAREN) 1 % GEL Apply topically 4 (four) times daily. (Patient not taking: Reported on 04/23/2020)   oxyCODONE-acetaminophen (PERCOCET) 10-325 MG tablet Take 1 tablet by mouth every 8 (eight) hours as needed for pain. (Patient not taking: Reported on 09/13/2021)   No facility-administered medications prior to visit.    Review of Systems  All other systems reviewed and are negative.         Objective:     BP 140/78   Pulse 72   Temp 98.2 F (36.8 C)   Ht 5\' 10"  (1.778 m)   Wt 161 lb 6.4 oz (73.2 kg)   SpO2 98%   BMI 23.16 kg/m  BP Readings from Last 3 Encounters:  09/13/21 140/78  04/23/20 140/78  02/12/20 (!) 130/96   Wt Readings from Last 3 Encounters:  09/13/21 161 lb 6.4 oz (73.2 kg)  04/23/20 166 lb (75.3 kg)  02/12/20 165 lb (74.8 kg)      Physical Exam  Alert and in no distress. Tympanic membranes and canals are normal. Pharyngeal area is normal. Neck is supple without adenopathy or thyromegaly. Cardiac exam shows a regular sinus rhythm without murmurs or gallops. Lungs are clear to auscultation.  Last CBC Lab Results  Component Value Date   WBC 5.6 04/23/2020   HGB 14.6 04/23/2020   HCT 43.3 04/23/2020   MCV 92 04/23/2020   MCH 30.9 04/23/2020   RDW 12.6 04/23/2020   PLT 273 04/23/2020   Last metabolic panel Lab Results  Component Value Date   GLUCOSE 100 (H) 04/23/2020   NA 142 04/23/2020   K 4.5 04/23/2020   CL 102 04/23/2020   CO2 24 04/23/2020   BUN 14 04/23/2020   CREATININE 0.85 04/23/2020   GFRNONAA 91 04/23/2020   CALCIUM 9.7 04/23/2020   PROT 6.8 04/23/2020   ALBUMIN 4.7 04/23/2020   LABGLOB 2.1 04/23/2020   AGRATIO 2.2  04/23/2020   BILITOT 0.5 04/23/2020   ALKPHOS 77 04/23/2020   AST 26 04/23/2020   ALT 19 04/23/2020   Last lipids Lab Results  Component Value Date   CHOL 241 (H) 04/23/2020   HDL 92 04/23/2020   LDLCALC 120 (H) 04/23/2020   TRIG 169 (H) 04/23/2020   CHOLHDL 2.6 04/23/2020        Assessment &  Plan:    Routine general medical examination at a health care facility - Plan: CBC with Differential/Platelet, Comprehensive metabolic panel, Lipid panel  Attention deficit disorder, unspecified hyperactivity presence - Plan: CBC with Differential/Platelet, Comprehensive metabolic panel  Chronic midline low back pain without sciatica  Arthritis of both knees  Screening for lipid disorders - Plan: Lipid panel  Immunization refused - He has extreme response to immunization lasting 5 or 6 days.  Immunization History  Administered Date(s) Administered   Hepatitis A 04/02/2007, 10/29/2007   Hepatitis A, Ped/Adol-2 Dose 04/02/2007, 10/29/2007   Janssen (J&J) SARS-COV-2 Vaccination 01/07/2020   Tdap 04/02/2007, 03/03/2016, 12/15/2018   Zoster Recombinat (Shingrix) 01/29/2017, 04/17/2017    Health Maintenance  Topic Date Due   Pneumonia Vaccine 28+ Years old (1 - PCV) Never done   COVID-19 Vaccine (2 - Booster for [employer] series) 03/03/2020   INFLUENZA VACCINE  10/04/2021   Fecal DNA (Cologuard)  05/26/2023   TETANUS/TDAP  12/14/2028   Hepatitis C Screening  Completed   Zoster Vaccines- Shingrix  Completed   HPV VACCINES  Aged Out  I discussed increasing his ADD medication to twice per day but at this point he is comfortable with his present medication regimen.  I will keep this in mind in the future.  Again discussed vaccines and he is not interested in pursuing this for any vaccine.  Encouraged him to be as active as he possibly can.  Discussed health benefits of physical activity, and encouraged him to engage in regular exercise appropriate for his age and condition.  Problem  List Items Addressed This Visit     ADD (attention deficit disorder)   Relevant Orders   CBC with Differential/Platelet   Comprehensive metabolic panel   Arthritis of both knees   Relevant Medications   OXYCODONE HCL PO   Chronic midline low back pain without sciatica   Relevant Medications   OXYCODONE HCL PO   Other Visit Diagnoses     Routine general medical examination at a health care facility    -  Primary   Relevant Orders   CBC with Differential/Platelet   Comprehensive metabolic panel   Lipid panel   Screening for lipid disorders       Relevant Orders   Lipid panel      Return in about 1 year (around 09/14/2022) for fasting pe .     Sharlot Gowda, MD

## 2021-09-14 DIAGNOSIS — M5416 Radiculopathy, lumbar region: Secondary | ICD-10-CM | POA: Diagnosis not present

## 2021-09-14 DIAGNOSIS — M961 Postlaminectomy syndrome, not elsewhere classified: Secondary | ICD-10-CM | POA: Diagnosis not present

## 2021-09-14 LAB — LIPID PANEL
Chol/HDL Ratio: 2.2 ratio (ref 0.0–5.0)
Cholesterol, Total: 213 mg/dL — ABNORMAL HIGH (ref 100–199)
HDL: 99 mg/dL (ref >39)
LDL Chol Calc (NIH): 98 mg/dL (ref 0–99)
Triglycerides: 90 mg/dL (ref 0–149)
VLDL Cholesterol Cal: 16 mg/dL (ref 5–40)

## 2021-09-14 LAB — CBC WITH DIFFERENTIAL/PLATELET
Basophils Absolute: 0.1 10*3/uL (ref 0.0–0.2)
Basos: 2 %
EOS (ABSOLUTE): 0.1 10*3/uL (ref 0.0–0.4)
Eos: 2 %
Hematocrit: 41.9 % (ref 37.5–51.0)
Hemoglobin: 13.9 g/dL (ref 13.0–17.7)
Immature Grans (Abs): 0 10*3/uL (ref 0.0–0.1)
Immature Granulocytes: 0 %
Lymphocytes Absolute: 1.1 10*3/uL (ref 0.7–3.1)
Lymphs: 20 %
MCH: 30.4 pg (ref 26.6–33.0)
MCHC: 33.2 g/dL (ref 31.5–35.7)
MCV: 92 fL (ref 79–97)
Monocytes Absolute: 0.5 10*3/uL (ref 0.1–0.9)
Monocytes: 10 %
Neutrophils Absolute: 3.5 10*3/uL (ref 1.4–7.0)
Neutrophils: 66 %
Platelets: 248 10*3/uL (ref 150–450)
RBC: 4.57 x10E6/uL (ref 4.14–5.80)
RDW: 13.1 % (ref 11.6–15.4)
WBC: 5.4 10*3/uL (ref 3.4–10.8)

## 2021-09-14 LAB — COMPREHENSIVE METABOLIC PANEL
ALT: 28 IU/L (ref 0–44)
AST: 35 IU/L (ref 0–40)
Albumin/Globulin Ratio: 2.2 (ref 1.2–2.2)
Albumin: 4.8 g/dL (ref 3.9–4.9)
Alkaline Phosphatase: 97 IU/L (ref 44–121)
BUN/Creatinine Ratio: 19 (ref 10–24)
BUN: 20 mg/dL (ref 8–27)
Bilirubin Total: 0.5 mg/dL (ref 0.0–1.2)
CO2: 23 mmol/L (ref 20–29)
Calcium: 9.5 mg/dL (ref 8.6–10.2)
Chloride: 102 mmol/L (ref 96–106)
Creatinine, Ser: 1.07 mg/dL (ref 0.76–1.27)
Globulin, Total: 2.2 g/dL (ref 1.5–4.5)
Glucose: 100 mg/dL — ABNORMAL HIGH (ref 70–99)
Potassium: 4.4 mmol/L (ref 3.5–5.2)
Sodium: 141 mmol/L (ref 134–144)
Total Protein: 7 g/dL (ref 6.0–8.5)
eGFR: 77 mL/min/{1.73_m2} (ref >59)

## 2021-09-16 ENCOUNTER — Telehealth: Payer: Self-pay | Admitting: Internal Medicine

## 2021-09-16 NOTE — Telephone Encounter (Signed)
Pt called and needs a refill on his Adderall but he wants it PRINTED as its so hard to find in stores that he will come pick up on Monday and find out who has it

## 2021-09-16 NOTE — Telephone Encounter (Signed)
Pt was notified and will call to see who has medication and then let us know

## 2021-09-19 ENCOUNTER — Telehealth: Payer: Self-pay | Admitting: Family Medicine

## 2021-09-19 DIAGNOSIS — M545 Low back pain, unspecified: Secondary | ICD-10-CM

## 2021-09-19 DIAGNOSIS — M1990 Unspecified osteoarthritis, unspecified site: Secondary | ICD-10-CM

## 2021-09-19 MED ORDER — DICLOFENAC SODIUM 75 MG PO TBEC: 75.0000 mg | DELAYED_RELEASE_TABLET | Freq: Two times a day (BID) | ORAL | 1 refills | Status: DC

## 2021-09-19 NOTE — Telephone Encounter (Signed)
Pt called for refills of diclofenac 75 mg tablets. Please send to [facility] [address]. Pt can be reached at [phone].

## 2021-09-21 ENCOUNTER — Ambulatory Visit: Payer: BC Managed Care – PPO | Admitting: Family Medicine

## 2021-09-21 DIAGNOSIS — J40 Bronchitis, not specified as acute or chronic: Secondary | ICD-10-CM

## 2021-09-21 DIAGNOSIS — J019 Acute sinusitis, unspecified: Secondary | ICD-10-CM | POA: Insufficient documentation

## 2021-09-21 MED ORDER — BENZONATATE 100 MG PO CAPS: 100.0000 mg | ORAL_CAPSULE | Freq: Two times a day (BID) | ORAL | 0 refills | Status: DC | PRN

## 2021-09-21 MED ORDER — AZITHROMYCIN 500 MG PO TABS: 500.0000 mg | ORAL_TABLET | Freq: Every day | ORAL | 0 refills | Status: DC
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
## 2021-10-18 ENCOUNTER — Ambulatory Visit: Payer: Commercial Managed Care - PPO | Admitting: Family Medicine

## 2021-11-01 ENCOUNTER — Telehealth: Payer: Self-pay | Admitting: Family Medicine

## 2021-11-01 DIAGNOSIS — F988 Other specified behavioral and emotional disorders with onset usually occurring in childhood and adolescence: Secondary | ICD-10-CM

## 2021-11-01 MED ORDER — AMPHETAMINE-DEXTROAMPHET ER 30 MG PO CP24: 30.0000 mg | ORAL_CAPSULE | Freq: Every day | ORAL | 0 refills | Status: DC

## 2021-11-01 NOTE — Telephone Encounter (Signed)
Needs adderall refill  Needs sent to [facility] they have in stock

## 2021-11-04 DIAGNOSIS — Z96652 Presence of left artificial knee joint: Secondary | ICD-10-CM | POA: Insufficient documentation

## 2021-11-04 DIAGNOSIS — M1712 Unilateral primary osteoarthritis, left knee: Secondary | ICD-10-CM | POA: Insufficient documentation

## 2021-11-08 DIAGNOSIS — M1712 Unilateral primary osteoarthritis, left knee: Secondary | ICD-10-CM | POA: Diagnosis not present

## 2021-11-09 ENCOUNTER — Encounter: Payer: Self-pay | Admitting: Internal Medicine

## 2021-12-02 ENCOUNTER — Other Ambulatory Visit: Payer: Self-pay | Admitting: Medical

## 2021-12-02 ENCOUNTER — Telehealth: Payer: Self-pay

## 2021-12-02 DIAGNOSIS — F988 Other specified behavioral and emotional disorders with onset usually occurring in childhood and adolescence: Secondary | ICD-10-CM

## 2021-12-02 MED ORDER — AMPHETAMINE-DEXTROAMPHET ER 30 MG PO CP24: 30.0000 mg | ORAL_CAPSULE | ORAL | 0 refills | Status: DC

## 2021-12-02 NOTE — Telephone Encounter (Signed)
Pt. Called stating he needs a refill on his Adderall but needs it sent into [facility] last apt was 09/21/21 next apt 09/19/22.

## 2021-12-13 ENCOUNTER — Encounter: Payer: Self-pay | Admitting: Internal Medicine

## 2021-12-26 ENCOUNTER — Encounter: Payer: Self-pay | Admitting: Internal Medicine

## 2021-12-29 DIAGNOSIS — Z79899 Other long term (current) drug therapy: Secondary | ICD-10-CM | POA: Diagnosis not present

## 2021-12-29 DIAGNOSIS — G4700 Insomnia, unspecified: Secondary | ICD-10-CM | POA: Diagnosis not present

## 2021-12-29 DIAGNOSIS — M47896 Other spondylosis, lumbar region: Secondary | ICD-10-CM | POA: Diagnosis not present

## 2021-12-29 DIAGNOSIS — M961 Postlaminectomy syndrome, not elsewhere classified: Secondary | ICD-10-CM | POA: Diagnosis not present

## 2021-12-29 DIAGNOSIS — Z5181 Encounter for therapeutic drug level monitoring: Secondary | ICD-10-CM | POA: Diagnosis not present

## 2022-01-24 ENCOUNTER — Inpatient Hospital Stay: Payer: Self-pay

## 2022-01-24 ENCOUNTER — Encounter: Payer: Self-pay | Admitting: Family Medicine

## 2022-01-24 ENCOUNTER — Ambulatory Visit: Payer: BC Managed Care – PPO | Admitting: Family Medicine

## 2022-01-24 VITALS — BP 140/84 | HR 60 | Ht 70.0 in | Wt 169.0 lb

## 2022-01-24 DIAGNOSIS — M79605 Pain in left leg: Secondary | ICD-10-CM

## 2022-01-24 NOTE — Progress Notes (Signed)
Subjective:    Patient ID: Nicholas [name], male    DOB: 08-May-1955, 66 y.o.   MRN: [id number]  HPI He states that last Thursday he noted the onset of left medial upper calf pain and swelling.  He had just flown back from [facility].  While in [facility] he did do more walking than normal.  He has a previous history of damage to that leg specifically to the S1 nerve root and has had there for some atrophy of that leg.  No chest pain, shortness of breath, or SOB.   Review of Systems     Objective:   Physical Exam Exam of the left leg does show Atrophy with a 1 x 4 inch area of firmness and pain on palpation to the medial upper calf area.  Denna Haggard' sign was essentially negative.  No palpable tenderness proximal to that.       Assessment & Plan:  Left leg pain - Plan: [facility] LOWER EXTREMITY VENOUS-LEFT (BACK OFFICE), VAS [place] LOWER EXTREMITY VENOUS (DVT) I will get an ultrasound on the [place] do not think this is truly a DVT I think it could easily be a superficial phlebitis and recommend heat for 20 minutes 3 times per day and Voltaren until we can get an ultrasound on this area.

## 2022-01-25 ENCOUNTER — Ambulatory Visit (HOSPITAL_COMMUNITY)
Admission: RE | Admit: 2022-01-25 | Discharge: 2022-01-25 | Disposition: A | Discharge status: Home / Self Care | Payer: BC Managed Care – PPO | Source: Ambulatory Visit | Attending: Family Medicine | Admitting: Family Medicine

## 2022-01-25 ENCOUNTER — Telehealth: Payer: Self-pay | Admitting: *Deleted

## 2022-01-25 DIAGNOSIS — M79605 Pain in left leg: Secondary | ICD-10-CM

## 2022-01-25 NOTE — Progress Notes (Signed)
Left lower ext venous study  has been completed. Refer to [facility] under chart review to view preliminary results. Results given to [place].  01/25/2022  2:36 PM Nicholas [name], Gerarda Gunther

## 2022-01-25 NOTE — Telephone Encounter (Signed)
Called patient to let him know that [place] was negative for DVT and let him know there was cyst. Let him know that Dr. Susann Givens recommended heat and continued use of diclofenac and to let us know in a week or so if not better and referral to sports med can be done. Patient stated he was using CBD roller ball (NO THC) and it is helping, can he continue with this?

## 2022-01-31 ENCOUNTER — Telehealth: Payer: Self-pay | Admitting: Family Medicine

## 2022-01-31 NOTE — Telephone Encounter (Signed)
Pt called and said he went and got his mri done. Things with his leg pain hasn't gotten any better and he wanted to know what are the next steps and will you be referring him to someone?

## 2022-02-01 ENCOUNTER — Other Ambulatory Visit: Payer: Self-pay

## 2022-02-01 DIAGNOSIS — M79605 Pain in left leg: Secondary | ICD-10-CM

## 2022-02-02 ENCOUNTER — Ambulatory Visit (INDEPENDENT_AMBULATORY_CARE_PROVIDER_SITE_OTHER): Payer: BC Managed Care – PPO

## 2022-02-02 ENCOUNTER — Ambulatory Visit: Payer: Self-pay

## 2022-02-02 ENCOUNTER — Encounter: Payer: Self-pay | Admitting: Sports Medicine

## 2022-02-02 ENCOUNTER — Ambulatory Visit: Payer: BC Managed Care – PPO | Admitting: Sports Medicine

## 2022-02-02 DIAGNOSIS — M7989 Other specified soft tissue disorders: Secondary | ICD-10-CM | POA: Diagnosis not present

## 2022-02-02 DIAGNOSIS — M62562 Muscle wasting and atrophy, not elsewhere classified, left lower leg: Secondary | ICD-10-CM

## 2022-02-02 NOTE — Progress Notes (Signed)
Complaining of left calf pain; states there is "knot" on back of leg Negative DVT scan  History of lumbar surgery; has not gained full feeling back in the lower leg from that

## 2022-02-02 NOTE — Progress Notes (Signed)
Nicholas [name] - 66 y.o. male MRN [id number]  Date of birth: [birth year]  Office Visit Note: Visit Date: 02/02/2022 PCP: Nicholas Nian, MD Referred by: Nicholas Nian, MD  Subjective: Chief Complaint  Patient presents with   Left Leg - Pain   HPI: Nicholas [name] is a pleasant 66 y.o. male who presents today for left posterior leg mass and pain.  Initially seen by Nicholas [name], and referred over for possible [place].  He did previously have a lower extremity venous Doppler scan which did not reveal any evidence of DVT.  I did personally review the scan which showed a large cystic structure from the proximal to mid calf approximately 8 x 5 x 2 cm in nature.   Patient has noted a firm induration and pain over the left proximal calf for the last 2-1/2-3 weeks.  Denies any known injury.  He does rather advanced arthritis of the knee which she is aware of.  He has a history of lumbar surgery about 7 years ago when he had damage to the S1 nerve root and has had atrophy of the left calf since that time.  Has been taking diclofenac and heating/massaging the area.  This pain is may be slightly improved but still present.  Pertinent ROS were reviewed with the patient and found to be negative unless otherwise specified above in HPI.   Assessment & Plan: Visit Diagnoses:  1. Mass of soft tissue of left lower extremity   2. Left calf atrophy    Plan: I discussed with Nicholas [name] the findings on both his x-ray and ultrasound.  There is hypoechoic fluid cystic structure with mixed echogenicity and some calcification within the posterior medial proximal calf.  I cannot clearly visualize this extending from the knee joint or a clear Baker's cyst identified within the popliteal fossa.  His x-ray also shows calcific change in the region of his pain, which I would not expect with a simple ganglion cyst or synovial cyst. Given the uncertainty and complex nature of his pathology, I feel it is best to proceed with MRI  with and without contrast to better quantify the etiology of the mass.  I believe this is the best course of action as opposed to attempted needle aspiration given the relative uncertainty of the true diagnosis.  He may continue his diclofenac and heat until this time.  He will follow-up 3 business days after MRI to discuss next steps.  Follow-up: Return for Follow-up 3 days after MRI .   Meds & Orders: No orders of the defined types were placed in this encounter.   Orders Placed This Encounter  Procedures   XR Tibia/Fibula Left   [place] Extrem Low Left Ltd   MR TIBIA FIBULA LEFT W WO CONTRAST     Procedures: No procedures performed      Clinical History: No specialty comments available.  He reports that he has never smoked. He has never used smokeless tobacco. No results for input(s): "HGBA1C", "LABURIC" in the last 8760 hours.  Objective:   Vital Signs: There were no vitals taken for this visit.  Physical Exam  Gen: Well-appearing, in no acute distress; non-toxic CV: Regular Rate. Well-perfused. Warm.  Resp: Breathing unlabored on room air; no wheezing. Psych: Fluid speech in conversation; appropriate affect; normal thought process Neuro: Sensation intact throughout. No gross coordination deficits.   Ortho Exam - Left leg: There is notable calf atrophy on the left compared to the right.  There is  firm induration in the inferior aspect of the popliteal fossa in the posterior medial aspect of the proximal calf.  This is tender to palpation.  There is no significant warmth.  There is some associated swelling around the region without erythema or ecchymosis.  Imaging: [place] Extrem Low Left Ltd  Result Date: 02/02/2022 Limited musculoskeletal ultrasound of the posterior knee and left calf was performed.  Evaluation of the posterior knee and popliteal fossa there is a trace fluid in this region, although no clear Baker's cyst identified.  The inferior aspect of the popliteal fossa and  scanning distally there is a rather large hypoechoic fluid structure with mixed echogenicity and some calcification, this is about 8 x 5 cm in long axis view.  This has some distend ability, although his slightly firm upon sono palpation.  There is some cobblestoning and mild hyperemia surrounding this, although no gross vascular uptake visualized.   XR Tibia/Fibula Left  Result Date: 02/02/2022 2 views including AP and lateral of both the proximal and distal aspect of the tibia and fibula were ordered and reviewed by myself.  X-rays demonstrate rather advanced knee osteoarthritis with evidence of chondrocalcinosis.  Over his region of pain there is abnormal calcification within the soft tissue over the posterior medial aspect of the proximal calf musculature.  This is about 6.5 cm in length.        Past Medical/Family/Surgical/Social History: Medications & Allergies reviewed per EMR, new medications updated. Patient Active Problem List   Diagnosis Date Noted   Acute non-recurrent sinusitis 09/21/2021   Immunization refused 09/13/2021   History of COVID-19 02/12/2020   Left calf atrophy 09/19/2016   Chronic midline low back pain without sciatica 12/07/2015   Arthritis of both knees 12/07/2015   ADD (attention deficit disorder) 12/05/2010   Past Medical History:  Diagnosis Date   ADD (attention deficit disorder)    DDD (degenerative disc disease), cervical 04/2002   Diverticulosis    Fibromyalgia    Hemorrhoids    Pancreatic alpha-amylase deficiency (HCC)    Family History  Problem Relation Age of Onset   Arthritis Mother    Arthritis Maternal Grandmother    Heart disease Neg Hx    Hyperlipidemia Neg Hx    Hypertension Neg Hx    Stroke Neg Hx    Cancer Neg Hx    Neuromuscular disorder Neg Hx    Past Surgical History:  Procedure Laterality Date   LAMINECTOMY Bilateral    decompression od L3-L4, L4-L5 with bilateral foraminotomies via L4 laminectomy with undercutting L3 and  L5 lamina.   LUMBAR DISC SURGERY Left    Left L3-L4 diskectomy   NASAL SINUS SURGERY     Social History   Occupational History   Occupation: Artist sports    Employer: [employer]  Tobacco Use   Smoking status: Never   Smokeless tobacco: Never  Substance and Sexual Activity   Alcohol use: Yes    Alcohol/week: 1.0 standard drink of alcohol    Types: 1 Standard drinks or equivalent per week   Drug use: No   Sexual activity: Not Currently    Comment: works in television, golf, married;

## 2022-02-08 ENCOUNTER — Ambulatory Visit: Payer: BC Managed Care – PPO | Admitting: Surgery

## 2022-02-08 DIAGNOSIS — M1712 Unilateral primary osteoarthritis, left knee: Secondary | ICD-10-CM

## 2022-02-08 DIAGNOSIS — M25462 Effusion, left knee: Secondary | ICD-10-CM | POA: Diagnosis not present

## 2022-02-08 NOTE — Progress Notes (Signed)
Office Visit Note   Patient: Nicholas [name]           Date of Birth: 04/06/1955           MRN: [id number] Visit Date: 02/08/2022              Requested by: Ronnald Nian, MD [address] [place],  [place] [address] PCP: Ronnald Nian, MD   Assessment & Plan: Visit Diagnoses:  1. Primary osteoarthritis of right knee     Plan: I reviewed patient's right knee x-ray with Dr. August Saucer today.  He agrees that there is concern about the proximal medial tibia large area of bony erosion.  I had artery advised patient that he is going to need definitive treatment with total knee replacement and with the findings on x-ray that this is already going to be a complicated procedure.  He had voiced that he did not want to have anything done for at least a year.  I also made Dr. August Saucer aware of this and he did speak with patient and told him that surgery would be better done sooner than later.  Patient stated that he would consider having this done around April because he is busy with traveling for his job.  He requested that left knee be aspirated today.  After patient consent left knee was prepped with Betadine and 100 cc of fluid was aspirated from a superolateral patellar approach and then intra-articular Marcaine/Depo-Medrol injection was performed.  I will have patient follow-up with Dr. August Saucer mid-March 2024 and I will schedule him to have CT left knee before that visit with Dr. August Saucer.  Follow-Up Instructions: Return in about 14 weeks (around 05/17/2022) for with dr dean to review left knee CT scan and rediscuss left total knee replacement.   Orders:  No orders of the defined types were placed in this encounter.  No orders of the defined types were placed in this encounter.     Procedures: Large Joint Inj: L knee on 02/08/2022 2:19 PM Indications: pain Details: 18 G 1.5 in needle, superolateral approach Medications: 3 mL lidocaine 1 %; 8 mL bupivacaine 0.25 %; 80 mg methylPREDNISolone acetate  40 MG/ML Aspirate: 100 mL serous and cloudy Consent was given by the patient. Patient was prepped and draped in the usual sterile fashion.      Clinical Data: No additional findings.   Subjective: Chief Complaint  Patient presents with   Left Knee - Pain    HPI 66 year old white male comes in today requesting left knee aspiration.  Patient has been followed by Dr. Shon Baton for left proximal calf pain.  He is scheduled for MRI tib-fib.  States that he has had acute swelling in his knee over the last couple days.  No known history of gout.  Knee is stiff with decreased range of motion.  Also painful to ambulate.  He also still complains of pain posterior knee into the proximal calf.  Patient states that he has had multiple knee scopes by Dr. Mckinley Jewel several years ago.  After discussion with the patient I was working with Dr. Eulah Pont at that time.  Previous knee x-rays do show end-stage DJD with large area of bony erosion proximal medial tibia plateau.    Review of Systems No complaints of cardiopulmonary GI/GU issues  Objective: Vital Signs: There were no vitals taken for this visit.  Physical Exam HENT:     Head: Normocephalic and atraumatic.     Nose: Nose normal.  Eyes:     Extraocular Movements: Extraocular movements intact.  Musculoskeletal:     Comments: Gait is antalgic.  Knee range of motion about 10 to 90 degrees.  Patient has large effusion.  No signs of infection.  Knee is diffusely tender.  Feels like he does have a tender Baker's cyst as well.  Neurological:     Mental Status: He is alert and oriented to person, place, and time.  Psychiatric:        Mood and Affect: Mood normal.     Ortho Exam  Specialty Comments:  No specialty comments available.  Imaging: No results found.   PMFS History: Patient Active Problem List   Diagnosis Date Noted   Acute non-recurrent sinusitis 09/21/2021   Immunization refused 09/13/2021   History of COVID-19  02/12/2020   Left calf atrophy 09/19/2016   Chronic midline low back pain without sciatica 12/07/2015   Arthritis of both knees 12/07/2015   ADD (attention deficit disorder) 12/05/2010   Past Medical History:  Diagnosis Date   ADD (attention deficit disorder)    DDD (degenerative disc disease), cervical 04/2002   Diverticulosis    Fibromyalgia    Hemorrhoids    Pancreatic alpha-amylase deficiency (HCC)     Family History  Problem Relation Age of Onset   Arthritis Mother    Arthritis Maternal Grandmother    Heart disease Neg Hx    Hyperlipidemia Neg Hx    Hypertension Neg Hx    Stroke Neg Hx    Cancer Neg Hx    Neuromuscular disorder Neg Hx     Past Surgical History:  Procedure Laterality Date   LAMINECTOMY Bilateral    decompression od L3-L4, L4-L5 with bilateral foraminotomies via L4 laminectomy with undercutting L3 and L5 lamina.   LUMBAR DISC SURGERY Left    Left L3-L4 diskectomy   NASAL SINUS SURGERY     Social History   Occupational History   Occupation: Artist sports    Employer: [employer]  Tobacco Use   Smoking status: Never   Smokeless tobacco: Never  Substance and Sexual Activity   Alcohol use: Yes    Alcohol/week: 1.0 standard drink of alcohol    Types: 1 Standard drinks or equivalent per week   Drug use: No   Sexual activity: Not Currently    Comment: works in television, golf, married;

## 2022-02-09 MED ORDER — BUPIVACAINE HCL 0.25 % IJ SOLN
8.0000 mL | INTRAMUSCULAR | Status: AC | PRN
  Administered 2022-02-08: 8 mL via INTRA_ARTICULAR

## 2022-02-09 MED ORDER — METHYLPREDNISOLONE ACETATE 40 MG/ML IJ SUSP
80.0000 mg | INTRAMUSCULAR | Status: AC | PRN
  Administered 2022-02-08: 80 mg via INTRA_ARTICULAR

## 2022-02-09 MED ORDER — LIDOCAINE HCL 1 % IJ SOLN
3.0000 mL | INTRAMUSCULAR | Status: AC | PRN
  Administered 2022-02-08: 3 mL

## 2022-02-10 ENCOUNTER — Telehealth: Payer: Self-pay | Admitting: Family Medicine

## 2022-02-10 ENCOUNTER — Other Ambulatory Visit: Payer: Self-pay | Admitting: Medical

## 2022-02-10 DIAGNOSIS — F988 Other specified behavioral and emotional disorders with onset usually occurring in childhood and adolescence: Secondary | ICD-10-CM

## 2022-02-10 MED ORDER — AMPHETAMINE-DEXTROAMPHET ER 30 MG PO CP24: 30.0000 mg | ORAL_CAPSULE | ORAL | 0 refills | Status: DC

## 2022-02-10 NOTE — Telephone Encounter (Signed)
Pt [id number] [place], [place] - [address] , they are the only ones who have adderall at this time

## 2022-02-11 ENCOUNTER — Ambulatory Visit (HOSPITAL_COMMUNITY)
Admission: RE | Admit: 2022-02-11 | Discharge: 2022-02-11 | Disposition: A | Discharge status: Home / Self Care | Payer: BC Managed Care – PPO | Source: Ambulatory Visit | Attending: Sports Medicine | Admitting: Sports Medicine

## 2022-02-11 ENCOUNTER — Other Ambulatory Visit: Payer: Self-pay | Admitting: Family Medicine

## 2022-02-11 DIAGNOSIS — F988 Other specified behavioral and emotional disorders with onset usually occurring in childhood and adolescence: Secondary | ICD-10-CM

## 2022-02-11 DIAGNOSIS — M7989 Other specified soft tissue disorders: Secondary | ICD-10-CM | POA: Diagnosis present

## 2022-02-11 MED ORDER — GADOBUTROL 1 MMOL/ML IV SOLN
7.5000 mL | Freq: Once | INTRAVENOUS | Status: AC | PRN
  Administered 2022-02-11: 7.5 mL via INTRAVENOUS

## 2022-02-11 MED ORDER — AMPHETAMINE-DEXTROAMPHET ER 30 MG PO CP24: 30.0000 mg | ORAL_CAPSULE | ORAL | 0 refills | Status: DC

## 2022-02-13 NOTE — Progress Notes (Signed)
Discussed MRI findings with patient. No worrisome findings for tumor, etc. Findings favor previous plantaris tear with hematoma vs. Leaking inferiorly prior baker's cyst. Will have f/u in office, will trial shockwave first vs. Aspiration.

## 2022-02-14 LAB — TIQ-NTM

## 2022-02-14 LAB — SYNOVIAL FLUID ANALYSIS, COMPLETE
Basophils, %: 0 %
Eosinophils-Synovial: 0 % (ref 0–2)
Lymphocytes-Synovial Fld: 0 % (ref 0–74)
Monocyte/Macrophage: 11 % (ref 0–69)
Neutrophil, Synovial: 89 % — ABNORMAL HIGH (ref 0–24)
Synoviocytes, %: 0 % (ref 0–15)
WBC, Synovial: 37680 cells/uL — ABNORMAL HIGH (ref <150)

## 2022-02-14 LAB — ANAEROBIC CULTURE W GRAM STAIN
MICRO NUMBER:: 14278871
SPECIMEN QUALITY:: ADEQUATE

## 2022-02-14 NOTE — Progress Notes (Signed)
Looks high..  Did you culture also and did you get any regular blood work like CBC DIF sed rate C-reactive protein.  Does not really seem infected from what you told me but it might be worth bringing him back can for systemic blood work and repeat aspiration later this week to see which weight is trending.  I had 1 patient likely has who had white count and recurrent effusion and nothing really ever grew but ID felt that she had some type of low-virulence infection.

## 2022-02-16 ENCOUNTER — Other Ambulatory Visit: Payer: Self-pay | Admitting: Surgery

## 2022-02-16 ENCOUNTER — Other Ambulatory Visit: Payer: Self-pay

## 2022-02-16 ENCOUNTER — Ambulatory Visit: Payer: BC Managed Care – PPO

## 2022-02-16 DIAGNOSIS — M25462 Effusion, left knee: Secondary | ICD-10-CM

## 2022-02-21 ENCOUNTER — Ambulatory Visit (INDEPENDENT_AMBULATORY_CARE_PROVIDER_SITE_OTHER): Payer: BC Managed Care – PPO | Admitting: Radiology

## 2022-02-21 DIAGNOSIS — M25462 Effusion, left knee: Secondary | ICD-10-CM

## 2022-02-23 ENCOUNTER — Ambulatory Visit (INDEPENDENT_AMBULATORY_CARE_PROVIDER_SITE_OTHER): Payer: BC Managed Care – PPO | Admitting: Sports Medicine

## 2022-02-23 ENCOUNTER — Encounter: Payer: Self-pay | Admitting: Sports Medicine

## 2022-02-23 DIAGNOSIS — M62562 Muscle wasting and atrophy, not elsewhere classified, left lower leg: Secondary | ICD-10-CM | POA: Diagnosis not present

## 2022-02-23 DIAGNOSIS — M7989 Other specified soft tissue disorders: Secondary | ICD-10-CM

## 2022-02-23 LAB — RHEUMATOID FACTOR: Rheumatoid fact SerPl-aCnc: <14 [IU]/mL

## 2022-02-23 LAB — CBC WITH DIFFERENTIAL/PLATELET
Absolute Monocytes: 470 cells/uL (ref 200–950)
Basophils Absolute: 92 cells/uL (ref 0–200)
Basophils Relative: 2.2 %
Eosinophils Absolute: 130 cells/uL (ref 15–500)
Eosinophils Relative: 3.1 %
HCT: 39 % (ref 38.5–50.0)
Hemoglobin: 13.3 g/dL (ref 13.2–17.1)
Lymphs Abs: 1247 cells/uL (ref 850–3900)
MCH: 30.9 pg (ref 27.0–33.0)
MCHC: 34.1 g/dL (ref 32.0–36.0)
MCV: 90.5 fL (ref 80.0–100.0)
MPV: 10.3 fL (ref 7.5–12.5)
Monocytes Relative: 11.2 %
Neutro Abs: 2260 cells/uL (ref 1500–7800)
Neutrophils Relative %: 53.8 %
Platelets: 239 10*3/uL (ref 140–400)
RBC: 4.31 10*6/uL (ref 4.20–5.80)
RDW: 12 % (ref 11.0–15.0)
Total Lymphocyte: 29.7 %
WBC: 4.2 10*3/uL (ref 3.8–10.8)

## 2022-02-23 LAB — ANA: Anti Nuclear Antibody (ANA): NEGATIVE

## 2022-02-23 LAB — SEDIMENTATION RATE: Sed Rate: 2 mm/h (ref 0–20)

## 2022-02-23 LAB — HLA-B27 ANTIGEN: HLA-B27 Antigen: NEGATIVE

## 2022-02-23 LAB — C-REACTIVE PROTEIN: CRP: 5 mg/L

## 2022-02-23 LAB — URIC ACID: Uric Acid, Serum: 6.2 mg/dL (ref 4.0–8.0)

## 2022-02-23 NOTE — Progress Notes (Signed)
Nicholas [name] - 66 y.o. male MRN [id number]  Date of birth: 04-01-1955  Office Visit Note: Visit Date: 02/23/2022 PCP: Ronnald Nian, MD Referred by: Ronnald Nian, MD  Subjective: Chief Complaint  Patient presents with   Left Leg - Follow-up   HPI: Nicholas [name] is a pleasant 66 y.o. male who presents today for left posterior leg pain and cystic formation.   Negative lower extremity doppler scan, no clot MRI demonstrated avascular cystic structure - possible leaking Baker's cyst vs. Hematoma from prior plantaris tendon rupture.  Today, he is doing fine. Has some pain in the calf moreso with just weight-bearing and push-off. Continues with oral diclofenac as needed and heating the area.  Of note, he was seen about 1 week after my initial evaluation and did have an exacerbation of the knee effusion which she had drained by Zonia Kief, PA-C.  This was sent off for culture.  Pertinent ROS were reviewed with the patient and found to be negative unless otherwise specified above in HPI.   Assessment & Plan: Visit Diagnoses:  1. Mass of soft tissue of left lower extremity   2. Left calf atrophy    Plan: Discussed with Nicholas [name] that his exact diagnosis is still slightly uncertain, but the MRI is suggestive of either a hematoma collection from a rupture of the plantaris muscle versus an inferior leaking prior Baker's cyst.  Discussed treatment options with him such as therapy, possible attempted aspiration to quantify the fluid, as well as extracorporeal shockwave therapy.  He would like to proceed with shockwave therapy.  Will see how he does with this and additional treatment and then discuss further treatments going forward.  He did have inflammatory aspiration performed by Zonia Kief previously and he did obtain blood work to rule out infectious causes. I discussed with Nicholas [name] to be sure he gets follow-up from this when his blood work returns, I am less suspicious the two conditions are  related but we will continue monitoring and he will f/u with ordering provider of the labwork.   Follow-up: Return in about 4 weeks (around 03/23/2022).   Meds & Orders: No orders of the defined types were placed in this encounter.  No orders of the defined types were placed in this encounter.    Procedures: Procedure: ECSWT Indications:  calf pain, possible hematoma   Procedure Details Consent: Risks of procedure as well as the alternatives and risks of each were explained to the patient.  Verbal consent for procedure obtained. Time Out: Verified patient identification, verified procedure, site was marked, verified correct patient position. The area was cleaned with alcohol swab.     The left proximal and mid-calf musculature was targeted for Extracorporeal shockwave therapy.    Preset: Status post muscular injury Power Level: 100 mJ Frequency: 10 - 12 Hz Impulse/cycles: 2500 Head size: Regular   Patient tolerated procedure well without immediate complications.         Clinical History: No specialty comments available.  He reports that he has never smoked. He has never used smokeless tobacco. No results for input(s): "HGBA1C", "LABURIC" in the last 8760 hours.  Objective:    Physical Exam  Gen: Well-appearing, in no acute distress; non-toxic CV: Well-perfused. Warm.  Resp: Breathing unlabored on room air; no wheezing. Psych: Fluid speech in conversation; appropriate affect; normal thought process Neuro: Sensation intact throughout. No gross coordination deficits.   Ortho Exam - Left leg: There is firm induration over the inferior and  medial aspect of the popliteal fossa extending into the proximal medial calf.  There is tenderness to palpation.  There is notable calf atrophy of the left compared to the right.  There is significant warmth or erythema. NVI.  Imaging:  MR TIBIA FIBULA LEFT W WO CONTRAST CLINICAL DATA:  Lower leg soft tissue mass. Deep cystic  structure from popliteal fossa to mid calf. Cystic structure with calcification starting a popliteal fossa mid calf.  EXAM: MRI OF LOWER LEFT EXTREMITY WITHOUT AND WITH CONTRAST  TECHNIQUE: Multiplanar, multisequence MR imaging of the left tibia and fibula was performed both before and after administration of intravenous contrast.  CONTRAST:  7.58mL GADAVIST GADOBUTROL 1 MMOL/ML IV SOLN  COMPARISON:  left tibia and fibula radiographs 02/02/2022, outside facility [facility]) ultrasound left lower extremity 02/02/2022  FINDINGS: Bones/Joint/Cartilage  There is severe medial compartment of the left knee osteoarthritis including diffuse high-grade partial to full-thickness cartilage loss, subchondral marrow edema, and cystic change. There is chronic concavity of the medial tibial plateau, as seen on prior radiographs. Moderate peripheral medial compartment degenerative osteophytes.  The right calf is included on coronal large field-of-view images. There are also severe medial compartment and moderate lateral compartment of the right knee osteoarthritis including pudding cartilage thinning and subchondral cystic change/marrow edema.  Ligaments  The medial collateral ligament and fibular collateral ligament of the left knee appear intact.  Muscles and Tendons  There is moderate soleus and mild medial head of the gastrocnemius greater than lateral head of the gastrocnemius feathery fatty infiltration. Mild edema and enhancement of the soleus and medial head of the gastrocnemius muscles around the cyst dissecting in between these muscles, described below.  Soft tissues  On the prior outside ultrasound, there appears to be an anechoic, avascular, thin-walled lobular cystic structure extending throughout the posterior left calf. This is seen on the current MRI as a similar oval, lobular fluid collection in between the soleus and medial head of the gastrocnemius muscles  demonstrating decreased T1 increased T2 signal, lack of central enhancement, and thin peripheral enhancement with mild enhancement within the surrounding musculature, measuring up to 1.0 x 1.9 x 10.1 cm (transverse by AP by craniocaudal), best seen on axial series 7 images 25 through 43 and coronal series 4 images 7 through 12. There is no solid mass or nodularity visualized. This cystic structure tapers superiorly toward the region of the popliteal vessels (axial series 7 images 23 through 25), and just medial to this is the semimembranosus-medial gastrocnemius bursa where there is a small "Baker's cyst." Question whether the fluid originated as fluid dissecting inferiorly from the Baker's cyst into the fascial planes in between the soleus and medial head of the gastrocnemius muscle. Nevertheless, the fluid also is in the exact location of the expected for a hematoma from a plantaris tendon rupture ("tennis leg").  IMPRESSION: 1. Corresponding to the avascular cystic structure on prior ultrasound, there is an oval, lobular fluid collection in between the left soleus and medial head of the gastrocnemius muscles. There is thin peripheral enhancement with mild enhancement within the surrounding musculature. This tapers/originates just inferolateral to a small Baker's cyst within the posterior knee. Question whether the fluid originated as fluid dissecting inferiorly from the Baker's cyst into the fascial planes in between the soleus and medial head of the gastrocnemius muscle. Nevertheless, the fluid also is in the exact location of the expected for a hematoma from a plantaris tendon rupture ("tennis leg"), which is another possibility. 2. There is  moderate soleus and mild medial head of the gastrocnemius greater than lateral head of the gastrocnemius feathery fatty infiltration. Mild edema and enhancement within the soleus and medial head of the gastrocnemius muscles around the  cyst versus subacute hematoma dissecting in between these muscles.  Electronically Signed   By: Neita Garnet M.D.   On: 02/11/2022 12:03    Past Medical/Family/Surgical/Social History: Medications & Allergies reviewed per EMR, new medications updated. Patient Active Problem List   Diagnosis Date Noted   Acute non-recurrent sinusitis 09/21/2021   Immunization refused 09/13/2021   History of COVID-19 02/12/2020   Left calf atrophy 09/19/2016   Chronic midline low back pain without sciatica 12/07/2015   Arthritis of both knees 12/07/2015   ADD (attention deficit disorder) 12/05/2010   Past Medical History:  Diagnosis Date   ADD (attention deficit disorder)    DDD (degenerative disc disease), cervical 04/2002   Diverticulosis    Fibromyalgia    Hemorrhoids    Pancreatic alpha-amylase deficiency (HCC)    Family History  Problem Relation Age of Onset   Arthritis Mother    Arthritis Maternal Grandmother    Heart disease Neg Hx    Hyperlipidemia Neg Hx    Hypertension Neg Hx    Stroke Neg Hx    Cancer Neg Hx    Neuromuscular disorder Neg Hx    Past Surgical History:  Procedure Laterality Date   LAMINECTOMY Bilateral    decompression od L3-L4, L4-L5 with bilateral foraminotomies via L4 laminectomy with undercutting L3 and L5 lamina.   LUMBAR DISC SURGERY Left    Left L3-L4 diskectomy   NASAL SINUS SURGERY     Social History   Occupational History   Occupation: Artist sports    Employer: [employer]  Tobacco Use   Smoking status: Never   Smokeless tobacco: Never  Substance and Sexual Activity   Alcohol use: Yes    Alcohol/week: 1.0 standard drink of alcohol    Types: 1 Standard drinks or equivalent per week   Drug use: No   Sexual activity: Not Currently    Comment: works in television, golf, married;

## 2022-03-14 ENCOUNTER — Encounter: Payer: Self-pay | Admitting: Sports Medicine

## 2022-03-15 NOTE — Progress Notes (Signed)
Can you call him and see if he can come back in in the next 6 w to see how the knee is thx

## 2022-03-29 ENCOUNTER — Other Ambulatory Visit: Payer: Self-pay | Admitting: Family Medicine

## 2022-03-29 DIAGNOSIS — J40 Bronchitis, not specified as acute or chronic: Secondary | ICD-10-CM

## 2022-04-19 DIAGNOSIS — M47896 Other spondylosis, lumbar region: Secondary | ICD-10-CM | POA: Diagnosis not present

## 2022-04-19 DIAGNOSIS — G4700 Insomnia, unspecified: Secondary | ICD-10-CM | POA: Diagnosis not present

## 2022-04-19 DIAGNOSIS — M961 Postlaminectomy syndrome, not elsewhere classified: Secondary | ICD-10-CM | POA: Diagnosis not present

## 2022-04-19 DIAGNOSIS — Z5181 Encounter for therapeutic drug level monitoring: Secondary | ICD-10-CM | POA: Diagnosis not present

## 2022-04-19 DIAGNOSIS — M1712 Unilateral primary osteoarthritis, left knee: Secondary | ICD-10-CM | POA: Diagnosis not present

## 2022-04-19 DIAGNOSIS — M5416 Radiculopathy, lumbar region: Secondary | ICD-10-CM | POA: Diagnosis not present

## 2022-04-27 DIAGNOSIS — M1712 Unilateral primary osteoarthritis, left knee: Secondary | ICD-10-CM | POA: Diagnosis not present

## 2022-05-01 ENCOUNTER — Telehealth: Payer: Self-pay | Admitting: Family Medicine

## 2022-05-01 DIAGNOSIS — F988 Other specified behavioral and emotional disorders with onset usually occurring in childhood and adolescence: Secondary | ICD-10-CM

## 2022-05-01 MED ORDER — AMPHETAMINE-DEXTROAMPHET ER 30 MG PO CP24: 30.0000 mg | ORAL_CAPSULE | ORAL | 0 refills | Status: DC

## 2022-05-01 NOTE — Telephone Encounter (Signed)
Pt called for refills of Adderall XR 30 mg. Please Send to [facility]. Pt can be reached at [phone].

## 2022-05-24 DIAGNOSIS — M25562 Pain in left knee: Secondary | ICD-10-CM | POA: Diagnosis not present

## 2022-06-05 ENCOUNTER — Telehealth: Payer: Self-pay | Admitting: Family Medicine

## 2022-06-05 DIAGNOSIS — F988 Other specified behavioral and emotional disorders with onset usually occurring in childhood and adolescence: Secondary | ICD-10-CM

## 2022-06-05 MED ORDER — AMPHETAMINE-DEXTROAMPHET ER 30 MG PO CP24: 30.0000 mg | ORAL_CAPSULE | ORAL | 0 refills | Status: DC

## 2022-06-05 NOTE — Telephone Encounter (Signed)
Pt called and is requesting a refill on his  Adderall xr [id number] [place], [place] - [place]

## 2022-06-27 ENCOUNTER — Encounter: Payer: Self-pay | Admitting: Family Medicine

## 2022-06-27 ENCOUNTER — Ambulatory Visit (INDEPENDENT_AMBULATORY_CARE_PROVIDER_SITE_OTHER): Payer: PPO | Admitting: Family Medicine

## 2022-06-27 VITALS — BP 138/88 | HR 60 | Temp 98.0°F | Resp 16 | Wt 156.0 lb

## 2022-06-27 DIAGNOSIS — M545 Low back pain, unspecified: Secondary | ICD-10-CM

## 2022-06-27 DIAGNOSIS — M1990 Unspecified osteoarthritis, unspecified site: Secondary | ICD-10-CM | POA: Diagnosis not present

## 2022-06-27 DIAGNOSIS — G8929 Other chronic pain: Secondary | ICD-10-CM

## 2022-06-27 DIAGNOSIS — Z2821 Immunization not carried out because of patient refusal: Secondary | ICD-10-CM | POA: Diagnosis not present

## 2022-06-27 NOTE — Progress Notes (Signed)
Subjective:    Patient ID: Nicholas [name], male    DOB: 10/17/1955, 67 y.o.   MRN: [id number]  HPI He is here for consult concerning impending TKR.  He has questions about when to possibly have this thing done especially in regard to work.  He is scheduled to have this done by Dr. Lequita Halt.  He also indicates that he has been to [facility] and has had 2 different kinds of gel injections 1 which worked very well Visit.  He is interested in potentially getting another 1. He does have chronic back pain and is using oxycodone for that.  Review of Systems     Objective:   Physical Exam Alert and in no distress otherwise not examined       Assessment & Plan:  Arthritis  Chronic midline low back pain without sciatica Discussed TKR with him and recommended he postpone this until after the golf season is over when he has more free time and will not be traveling.  Recommend that he get the information from [facility] as to which product worked the best and let Dr. Kirkland Hun know the name of the product and see if he will give him the shot.  It apparently lasted about 3 months.  He already has pain medicine on board with his back that can help postoperatively.  When his next Cologuard is due which would be next year.  Also discussed getting the pneumonia shot but he was not interested.

## 2022-07-17 ENCOUNTER — Telehealth: Payer: Self-pay | Admitting: Family Medicine

## 2022-07-17 DIAGNOSIS — F988 Other specified behavioral and emotional disorders with onset usually occurring in childhood and adolescence: Secondary | ICD-10-CM

## 2022-07-17 MED ORDER — AMPHETAMINE-DEXTROAMPHET ER 30 MG PO CP24: 30.0000 mg | ORAL_CAPSULE | ORAL | 0 refills | Status: DC

## 2022-07-17 NOTE — Telephone Encounter (Signed)
Pt called for refills of adderall xr. [facility] to [facility] [address]. Pt can be reached at [phone].

## 2022-08-08 ENCOUNTER — Telehealth: Payer: Self-pay | Admitting: Family Medicine

## 2022-08-08 NOTE — Telephone Encounter (Signed)
Called pt left message that pt needs surgical clearance before surgery the surgery date is 12/25/2022 Form is in brown folder

## 2022-08-17 DIAGNOSIS — M1712 Unilateral primary osteoarthritis, left knee: Secondary | ICD-10-CM | POA: Diagnosis not present

## 2022-09-06 ENCOUNTER — Telehealth: Payer: Self-pay | Admitting: Family Medicine

## 2022-09-06 ENCOUNTER — Other Ambulatory Visit: Payer: Self-pay | Admitting: Family Medicine

## 2022-09-06 DIAGNOSIS — M47816 Spondylosis without myelopathy or radiculopathy, lumbar region: Secondary | ICD-10-CM | POA: Diagnosis not present

## 2022-09-06 DIAGNOSIS — Z5181 Encounter for therapeutic drug level monitoring: Secondary | ICD-10-CM | POA: Diagnosis not present

## 2022-09-06 DIAGNOSIS — Z79899 Other long term (current) drug therapy: Secondary | ICD-10-CM | POA: Diagnosis not present

## 2022-09-06 DIAGNOSIS — M961 Postlaminectomy syndrome, not elsewhere classified: Secondary | ICD-10-CM | POA: Diagnosis not present

## 2022-09-06 DIAGNOSIS — F988 Other specified behavioral and emotional disorders with onset usually occurring in childhood and adolescence: Secondary | ICD-10-CM

## 2022-09-06 MED ORDER — AMPHETAMINE-DEXTROAMPHET ER 30 MG PO CP24: 30.0000 mg | ORAL_CAPSULE | ORAL | 0 refills | Status: DC

## 2022-09-06 NOTE — Telephone Encounter (Signed)
Sending to provider

## 2022-09-06 NOTE — Telephone Encounter (Signed)
Pt requesting a refill on [facility]derall to [id number] - [place], [place] - [address]

## 2022-09-07 ENCOUNTER — Other Ambulatory Visit: Payer: Self-pay | Admitting: Family Medicine

## 2022-09-07 DIAGNOSIS — J40 Bronchitis, not specified as acute or chronic: Secondary | ICD-10-CM

## 2022-09-11 ENCOUNTER — Ambulatory Visit: Admit: 2022-09-11 | Payer: BC Managed Care – PPO | Admitting: Orthopedic Surgery

## 2022-09-11 SURGERY — ARTHROPLASTY, KNEE, TOTAL
Anesthesia: Choice | Site: Knee | Laterality: Left

## 2022-09-19 ENCOUNTER — Ambulatory Visit: Payer: BC Managed Care – PPO | Admitting: Family Medicine

## 2022-10-11 ENCOUNTER — Telehealth: Payer: Self-pay | Admitting: Family Medicine

## 2022-10-11 DIAGNOSIS — F988 Other specified behavioral and emotional disorders with onset usually occurring in childhood and adolescence: Secondary | ICD-10-CM

## 2022-10-11 MED ORDER — AMPHETAMINE-DEXTROAMPHET ER 30 MG PO CP24: 30.0000 mg | ORAL_CAPSULE | ORAL | 0 refills | Status: DC

## 2022-10-11 NOTE — Telephone Encounter (Signed)
Pt requesting refill on adderall to  [id number] - [place], [place] - [address]

## 2022-11-15 DIAGNOSIS — M1712 Unilateral primary osteoarthritis, left knee: Secondary | ICD-10-CM | POA: Diagnosis not present

## 2022-11-16 ENCOUNTER — Encounter: Payer: Self-pay | Admitting: Family Medicine

## 2022-11-16 ENCOUNTER — Ambulatory Visit (INDEPENDENT_AMBULATORY_CARE_PROVIDER_SITE_OTHER): Payer: PPO | Admitting: Family Medicine

## 2022-11-16 VITALS — BP 122/68 | HR 71 | Wt 158.8 lb

## 2022-11-16 DIAGNOSIS — M1990 Unspecified osteoarthritis, unspecified site: Secondary | ICD-10-CM | POA: Diagnosis not present

## 2022-11-16 DIAGNOSIS — G8929 Other chronic pain: Secondary | ICD-10-CM | POA: Diagnosis not present

## 2022-11-16 DIAGNOSIS — M545 Low back pain, unspecified: Secondary | ICD-10-CM

## 2022-11-16 DIAGNOSIS — Z01818 Encounter for other preprocedural examination: Secondary | ICD-10-CM | POA: Diagnosis not present

## 2022-11-16 NOTE — Progress Notes (Signed)
Subjective:    Patient ID: Nicholas [name], male    DOB: 06/15/1955, 67 y.o.   MRN: [id number]  HPI He is here for preoperative exam before having left TKR.  This is scheduled for October 21.  Presently his main pain is with his back and he is on oxycodone for that and is monitored by pain management.  He is doing quite nicely on that.  He has had no chest pain, shortness of breath, PND, DOE.   Review of Systems     Objective:    Physical Exam Alert and in no distress.  Cardiac exam shows regular rhythm without murmurs or gallops.  Lungs are clear to auscultation.       Assessment & Plan:   Preop examination  Chronic midline low back pain without sciatica  Arthritis He is cleared for surgery.  Discussed with him postoperative care in terms of risk for infection as well as DVT/PE.

## 2022-11-17 ENCOUNTER — Telehealth: Payer: Self-pay | Admitting: Family Medicine

## 2022-11-17 DIAGNOSIS — F988 Other specified behavioral and emotional disorders with onset usually occurring in childhood and adolescence: Secondary | ICD-10-CM

## 2022-11-17 MED ORDER — AMPHETAMINE-DEXTROAMPHET ER 30 MG PO CP24
30.0000 mg | ORAL_CAPSULE | ORAL | 0 refills | Status: DC
Start: 2022-11-17 — End: 2023-03-28

## 2022-11-17 NOTE — Telephone Encounter (Signed)
Nicholas [name] is requesting a refill on adderall to  [id number] - [place], [place] - [address]

## 2022-12-04 NOTE — H&P (Signed)
TOTAL KNEE ADMISSION H&P  Patient is being admitted for left total knee arthroplasty.  Subjective:  Chief Complaint: Left knee pain.  HPI: Nicholas [name], 67 y.o. male has a history of pain and functional disability in the left knee due to arthritis and has failed non-surgical conservative treatments for greater than 12 weeks to include use of assistive devices and activity modification. Onset of symptoms was gradual, starting >10 years ago with gradually worsening course since that time. The patient noted no past surgery on the left knee.  Patient currently rates pain in the left knee at 7 out of 10 with activity. Patient has worsening of pain with activity and weight bearing and pain that interferes with activities of daily living. Patient has evidence of periarticular osteophytes and joint space narrowing, a massive defect in the posterior aspect of the medial tibial plateau coming from erosion from the medial femoral condyle by imaging studies. There is no active infection.  Patient Active Problem List   Diagnosis Date Noted   Osteoarthritis of left knee 11/04/2021   Acute non-recurrent sinusitis 09/21/2021   Immunization refused 09/13/2021   History of COVID-19 02/12/2020   Lumbar spondylosis 05/24/2018   Lumbar radiculopathy 04/09/2018   Left calf atrophy 09/19/2016   Chronic midline low back pain without sciatica 12/07/2015   Arthritis of both knees 12/07/2015   ADD (attention deficit disorder) 12/05/2010    Past Medical History:  Diagnosis Date   ADD (attention deficit disorder)    DDD (degenerative disc disease), cervical 04/2002   Diverticulosis    Fibromyalgia    Hemorrhoids    Pancreatic alpha-amylase deficiency (HCC)     Past Surgical History:  Procedure Laterality Date   LAMINECTOMY Bilateral    decompression od L3-L4, L4-L5 with bilateral foraminotomies via L4 laminectomy with undercutting L3 and L5 lamina.   LUMBAR DISC SURGERY Left    Left L3-L4 diskectomy    NASAL SINUS SURGERY      Prior to Admission medications   Medication Sig Start Date End Date Taking? Authorizing Provider  amphetamine-dextroamphetamine (ADDERALL XR) 30 MG 24 hr capsule Take 1 capsule (30 mg total) by mouth every morning. 11/17/22  Yes Ronnald Nian, MD  Ascorbic Acid (VITAMIN C) 1000 MG tablet Take 1,000 mg by mouth in the morning.   Yes [provider]  b complex vitamins capsule Take 1 capsule by mouth in the morning.   Yes [provider]  Cannabidiol POWD Apply 1 Application topically 3 (three) times daily as needed (knee/back pain.). CBD gel   Yes [provider]  CHLORPHENIRAMINE MALEATE PO Take 1 tablet by mouth in the morning.   Yes [provider]  diclofenac (VOLTAREN) 75 MG EC tablet Take 1 tablet (75 mg total) by mouth 2 (two) times daily. 09/19/21  Yes Ronnald Nian, MD  diclofenac sodium (VOLTAREN) 1 % GEL Apply topically 4 (four) times daily.   Yes [provider]  Multiple Vitamin (MULTIVITAMIN WITH MINERALS) TABS tablet Take 1 tablet by mouth in the morning.   Yes [provider]  Oxycodone HCl 10 MG TABS Take 10 mg by mouth 3 (three) times daily as needed (pain).   Yes [provider]  traZODone (DESYREL) 50 MG tablet Take 50 mg by mouth at bedtime and may repeat dose one time if needed. 01/23/22  Yes [provider]  zinc sulfate 220 (50 Zn) MG capsule Take 220 mg by mouth in the morning.   Yes [provider]  Allergies  Allergen Reactions   Amoxicillin-Pot Clavulanate Diarrhea and Nausea And Vomiting   Lactose Intolerance (Gi)    Latex Itching    Social History   Socioeconomic History   Marital status: Single    Spouse name: separated. lives alone   Number of children: Not on file   Years of education: Not on file   Highest education level: Not on file  Occupational History   Occupation: freelance broadcast sports    Employer: [employer]  Tobacco Use    Smoking status: Never   Smokeless tobacco: Never  Substance and Sexual Activity   Alcohol use: Not Currently    Alcohol/week: 7.0 standard drinks of alcohol    Types: 7 Standard drinks or equivalent per week   Drug use: No   Sexual activity: Not Currently    Comment: works in television, golf, married;   Other Topics Concern   Not on file  Social History Narrative   Not on file   Social Determinants of Health   Financial Resource Strain: Not on file  Food Insecurity: Not on file  Transportation Needs: Not on file  Physical Activity: Not on file  Stress: Not on file  Social Connections: Unknown (07/17/2021)   Received from [facility]   Social Network    Social Network: Not on file  Intimate Partner Violence: Unknown (06/08/2021)   Received from [facility]   HITS    Physically Hurt: Not on file    Insult or Talk Down To: Not on file    Threaten Physical Harm: Not on file    Scream or Curse: Not on file    Tobacco Use: Low Risk  (11/16/2022)   Patient History    Smoking Tobacco Use: Never    Smokeless Tobacco Use: Never    Passive Exposure: Not on file   Social History   Substance and Sexual Activity  Alcohol Use Not Currently   Alcohol/week: 7.0 standard drinks of alcohol   Types: 7 Standard drinks or equivalent per week    Family History  Problem Relation Age of Onset   Arthritis Mother    Arthritis Maternal Grandmother    Heart disease Neg Hx    Hyperlipidemia Neg Hx    Hypertension Neg Hx    Stroke Neg Hx    Cancer Neg Hx    Neuromuscular disorder Neg Hx     ROS  Objective:  Physical Exam: Well-developed male alert and oriented in no apparent distress    He has a significantly antalgic gait pattern on the left    Left hip has normal range of motion with discomfort.    His left knee shows a significant varus deformity. He has got a small effusion. His range of motion of the left knee is about 5 to 125. He does have  a small amount of varus/valgus laxity. No AP laxity.     His right knee has no effusion. Slight crepitus on range of motion. No joint line tenderness or instability.  Imaging: Weightbearing AP bilateral knees and lateral of the left knee taken in our office today and reviewed by myself reveals severe bone-on-bone arthritis in the medial compartment, patellofemoral bone-on-bone, and varus deformity. He has a massive defect in the posterior aspect of the medial tibial plateau coming from erosion from the medial femoral condyle.  Assessment/Plan:  End stage arthritis, left knee   The patient history, physical examination, clinical judgment of the provider and imaging studies are  consistent with end stage degenerative joint disease of the left knee and total knee arthroplasty is deemed medically necessary. The treatment options including medical management, injection therapy arthroscopy and arthroplasty were discussed at length. The risks and benefits of total knee arthroplasty were presented and reviewed. The risks due to aseptic loosening, infection, stiffness, patella tracking problems, thromboembolic complications and other imponderables were discussed. The patient acknowledged the explanation, agreed to proceed with the plan and consent was signed. Patient is being admitted for inpatient treatment for surgery, pain control, PT, OT, prophylactic antibiotics, VTE prophylaxis, progressive ambulation and ADLs and discharge planning. The patient is planning to be discharged  home .   Patient's anticipated LOS is less than 2 midnights, meeting these requirements: - Younger than 31 - Lives within 1 hour of care - Has a competent adult at home to recover with post-op recover - NO history of  - Chronic pain requiring opiods  - Diabetes  - Coronary Artery Disease  - Heart failure  - Heart attack  - Stroke  - DVT/VTE  - Cardiac arrhythmia  - Respiratory Failure/COPD  - Renal failure  - Anemia  -  Advanced Liver disease    Therapy Plans: EO Disposition: Home alone, older sister will check on him Planned DVT Prophylaxis: Aspirin 81mg  BID DME Needed: Has a walker PCP: Sharlot Gowda, MD (clearance received) TXA: IV Allergies: Augmentin (diarrhea, n/v), cottonwoods (environmental) Anesthesia Concerns: Has woken during surgery BMI: 23.3 Last HgbA1c: Not diabetic Pharmacy: [facility] [address]   Other: -LLE weakness and atrophy after S1 nerve injury during back surgery -Will likely require revision components for tibia given posteromedial defect -Pt reports skin reactions to metal and cannot wear watches or any jewelry. Metal allergy testing referral placed.  - Patient was instructed on what medications to stop prior to surgery. - Follow-up visit in 2 weeks with Dr. Lequita Halt - Begin physical therapy following surgery - Pre-operative lab work as pre-surgical testing - Prescriptions will be provided in hospital at time of discharge  Weston Brass, PA-C Orthopedic Surgery [facility] [place]

## 2022-12-11 NOTE — Progress Notes (Signed)
Anesthesia Review:  PCP: Sharlot Gowda LOV 11/16/22 Clearance 11/16/22 on chart  Cardiologist : Chest x-ray : EKG : Echo : Stress test: Cardiac Cath :  Activity level:  Sleep Study/ CPAP : Fasting Blood Sugar :      / Checks Blood Sugar -- times a day:   Blood Thinner/ Instructions /Last Dose: ASA / Instructions/ Last Dose :

## 2022-12-11 NOTE — Patient Instructions (Signed)
SURGICAL WAITING ROOM VISITATION  Patients having surgery or a procedure may have no more than 2 support people in the waiting area - these visitors may rotate.    Children under the age of 70 must have an adult with them who is not the patient.  Due to an increase in RSV and influenza rates and associated hospitalizations, children ages 55 and under may not visit patients in [facility] hospitals.  If the patient needs to stay at the hospital during part of their recovery, the visitor guidelines for inpatient rooms apply. Pre-op nurse will coordinate an appropriate time for 1 support person to accompany patient in pre-op.  This support person may not rotate.    Please refer to the [facility] website for the visitor guidelines for Inpatients (after your surgery is over and you are in a regular room).       Your procedure is scheduled on:  12/25/22    Report to [facility] Main Entrance    Report to admitting at  0600 AM   Call this number if you have problems the morning of surgery [phone]   Do not eat food :After Midnight.   After Midnight you may have the following liquids until __ 0530____ AM  DAY OF SURGERY  Water Non-Citrus Juices (without pulp, NO RED-Apple, White grape, White cranberry) Black Coffee (NO MILK/CREAM OR CREAMERS, sugar ok)  Clear Tea (NO MILK/CREAM OR CREAMERS, sugar ok) regular and decaf                             Plain Jell-O (NO RED)                                           Fruit ices (not with fruit pulp, NO RED)                                     Popsicles (NO RED)                                                               Sports drinks like Gatorade (NO RED)                   The day of surgery:  Drink ONE (1) Pre-Surgery Clear Ensure or G2 at 0530 AM  ( have completed by ) the morning of surgery. Drink in one sitting. Do not sip.  This drink was given to you during your hospital  pre-op appointment visit. Nothing else to drink  after completing the  Pre-Surgery Clear Ensure or G2.          If you have questions, please contact your surgeon's office.        Oral Hygiene is also important to reduce your risk of infection.                                    Remember - BRUSH YOUR TEETH THE MORNING OF SURGERY WITH  YOUR REGULAR TOOTHPASTE  DENTURES WILL BE REMOVED PRIOR TO SURGERY PLEASE DO NOT APPLY "Poly grip" OR ADHESIVES!!!   Do NOT smoke after Midnight   Stop all vitamins and herbal supplements 7 days before surgery.   Take these medicines the morning of surgery with A SIP OF WATER:  none   DO NOT TAKE ANY ORAL DIABETIC MEDICATIONS DAY OF YOUR SURGERY  Bring CPAP mask and tubing day of surgery.                              You may not have any metal on your body including hair pins, jewelry, and body piercing             Do not wear make-up, lotions, powders, perfumes/cologne, or deodorant  Do not wear nail polish including gel and S&S, artificial/acrylic nails, or any other type of covering on natural nails including finger and toenails. If you have artificial nails, gel coating, etc. that needs to be removed by a nail salon please have this removed prior to surgery or surgery may need to be canceled/ delayed if the surgeon/ anesthesia feels like they are unable to be safely monitored.   Do not shave  48 hours prior to surgery.               Men may shave face and neck.   Do not bring valuables to the hospital. [place] IS NOT             RESPONSIBLE   FOR VALUABLES.   Contacts, glasses, dentures or bridgework may not be worn into surgery.   Bring small overnight bag day of surgery.   DO NOT BRING YOUR HOME MEDICATIONS TO THE HOSPITAL. PHARMACY WILL DISPENSE MEDICATIONS LISTED ON YOUR MEDICATION LIST TO YOU DURING YOUR ADMISSION IN THE HOSPITAL!    Patients discharged on the day of surgery will not be allowed to drive home.  Someone NEEDS to stay with you for the first 24 hours after  anesthesia.   Special Instructions: Bring a copy of your healthcare power of attorney and living will documents the day of surgery if you haven't scanned them before.              Please read over the following fact sheets you were given: IF YOU HAVE QUESTIONS ABOUT YOUR PRE-OP INSTRUCTIONS PLEASE CALL [phone]   If you received a COVID test during your pre-op visit  it is requested that you wear a mask when out in public, stay away from anyone that may not be feeling well and notify your surgeon if you develop symptoms. If you test positive for Covid or have been in contact with anyone that has tested positive in the last 10 days please notify you surgeon.      Pre-operative 5 CHG Bath Instructions   You can play a key role in reducing the risk of infection after surgery. Your skin needs to be as free of germs as possible. You can reduce the number of germs on your skin by washing with CHG (chlorhexidine gluconate) soap before surgery. CHG is an antiseptic soap that kills germs and continues to kill germs even after washing.   DO NOT use if you have an allergy to chlorhexidine/CHG or antibacterial soaps. If your skin becomes reddened or irritated, stop using the CHG and notify one of our RNs at [phone].   Please shower with the CHG soap starting  4 days before surgery using the following schedule:     Please keep in mind the following:  DO NOT shave, including legs and underarms, starting the day of your first shower.   You may shave your face at any point before/day of surgery.  Place clean sheets on your bed the day you start using CHG soap. Use a clean washcloth (not used since being washed) for each shower. DO NOT sleep with pets once you start using the CHG.   CHG Shower Instructions:  If you choose to wash your hair and private area, wash first with your normal shampoo/soap.  After you use shampoo/soap, rinse your hair and body thoroughly to remove shampoo/soap residue.   Turn the water OFF and apply about 3 tablespoons (45 ml) of CHG soap to a CLEAN washcloth.  Apply CHG soap ONLY FROM YOUR NECK DOWN TO YOUR TOES (washing for 3-5 minutes)  DO NOT use CHG soap on face, private areas, open wounds, or sores.  Pay special attention to the area where your surgery is being performed.  If you are having back surgery, having someone wash your back for you may be helpful. Wait 2 minutes after CHG soap is applied, then you may rinse off the CHG soap.  Pat dry with a clean towel  Put on clean clothes/pajamas   If you choose to wear lotion, please use ONLY the CHG-compatible lotions on the back of this paper.     Additional instructions for the day of surgery: DO NOT APPLY any lotions, deodorants, cologne, or perfumes.   Put on clean/comfortable clothes.  Brush your teeth.  Ask your nurse before applying any prescription medications to the skin.      CHG Compatible Lotions   Aveeno Moisturizing lotion  Cetaphil Moisturizing Cream  Cetaphil Moisturizing Lotion  Clairol Herbal Essence Moisturizing Lotion, Dry Skin  Clairol Herbal Essence Moisturizing Lotion, Extra Dry Skin  Clairol Herbal Essence Moisturizing Lotion, Normal Skin  Curel Age Defying Therapeutic Moisturizing Lotion with Alpha Hydroxy  Curel Extreme Care Body Lotion  Curel Soothing Hands Moisturizing Hand Lotion  Curel Therapeutic Moisturizing Cream, Fragrance-Free  Curel Therapeutic Moisturizing Lotion, Fragrance-Free  Curel Therapeutic Moisturizing Lotion, Original Formula  Eucerin Daily Replenishing Lotion  Eucerin Dry Skin Therapy Plus Alpha Hydroxy Crme  Eucerin Dry Skin Therapy Plus Alpha Hydroxy Lotion  Eucerin Original Crme  Eucerin Original Lotion  Eucerin Plus Crme Eucerin Plus Lotion  Eucerin TriLipid Replenishing Lotion  Keri Anti-Bacterial Hand Lotion  Keri Deep Conditioning Original Lotion Dry Skin Formula Softly Scented  Keri Deep Conditioning Original Lotion, Fragrance  Free Sensitive Skin Formula  Keri Lotion Fast Absorbing Fragrance Free Sensitive Skin Formula  Keri Lotion Fast Absorbing Softly Scented Dry Skin Formula  Keri Original Lotion  Keri Skin Renewal Lotion Keri Silky Smooth Lotion  Keri Silky Smooth Sensitive Skin Lotion  Nivea Body Creamy Conditioning Oil  Nivea Body Extra Enriched Teacher, adult education Moisturizing Lotion Nivea Crme  Nivea Skin Firming Lotion  NutraDerm 30 Skin Lotion  NutraDerm Skin Lotion  NutraDerm Therapeutic Skin Cream  NutraDerm Therapeutic Skin Lotion  ProShield Protective Hand Cream  Provon moisturizing lotion

## 2022-12-13 ENCOUNTER — Other Ambulatory Visit: Payer: Self-pay

## 2022-12-13 ENCOUNTER — Encounter (HOSPITAL_COMMUNITY): Payer: Self-pay

## 2022-12-13 ENCOUNTER — Encounter (HOSPITAL_COMMUNITY)
Admission: RE | Admit: 2022-12-13 | Discharge: 2022-12-13 | Disposition: A | Discharge status: Home / Self Care | Payer: PPO | Source: Ambulatory Visit | Attending: Orthopedic Surgery | Admitting: Orthopedic Surgery

## 2022-12-13 ENCOUNTER — Ambulatory Visit: Payer: PPO | Admitting: Internal Medicine

## 2022-12-13 ENCOUNTER — Encounter: Payer: Self-pay | Admitting: Internal Medicine

## 2022-12-13 VITALS — BP 118/68 | HR 72 | Temp 97.6°F | Ht 69.09 in | Wt 158.3 lb

## 2022-12-13 VITALS — BP 159/94 | HR 72 | Temp 98.6°F | Resp 16 | Ht 71.5 in | Wt 155.0 lb

## 2022-12-13 DIAGNOSIS — M1712 Unilateral primary osteoarthritis, left knee: Secondary | ICD-10-CM | POA: Insufficient documentation

## 2022-12-13 DIAGNOSIS — Z01812 Encounter for preprocedural laboratory examination: Secondary | ICD-10-CM | POA: Diagnosis not present

## 2022-12-13 DIAGNOSIS — L230 Allergic contact dermatitis due to metals: Secondary | ICD-10-CM | POA: Diagnosis not present

## 2022-12-13 DIAGNOSIS — Z01818 Encounter for other preprocedural examination: Secondary | ICD-10-CM

## 2022-12-13 HISTORY — DX: Pneumonia, unspecified organism: J18.9

## 2022-12-13 HISTORY — DX: Other complications of anesthesia, initial encounter: T88.59XA

## 2022-12-13 LAB — CBC
HCT: 41.9 % (ref 39.0–52.0)
Hemoglobin: 13.6 g/dL (ref 13.0–17.0)
MCH: 30.3 pg (ref 26.0–34.0)
MCHC: 32.5 g/dL (ref 30.0–36.0)
MCV: 93.3 fL (ref 80.0–100.0)
Platelets: 234 10*3/uL (ref 150–400)
RBC: 4.49 MIL/uL (ref 4.22–5.81)
RDW: 13.6 % (ref 11.5–15.5)
WBC: 7.1 10*3/uL (ref 4.0–10.5)
nRBC: 0 % (ref 0.0–0.2)

## 2022-12-13 LAB — SURGICAL PCR SCREEN
MRSA, PCR: NEGATIVE
Staphylococcus aureus: NEGATIVE

## 2022-12-13 NOTE — Patient Instructions (Addendum)
Concern for contact dermatitis Discussed with patient that patch testing tests for contact dermatitis sometimes does not correlate to how one will react to metals in the body. Positive patch testing results can help in avoiding those items; however, it is possible to get false negative results.  Nevertheless, this is the most accessible test for metal sensitivity currently available.  There is no test available to rule out a metal allergy.  Please schedule for patch testing in our [facility] office - will need to come Friday for patch placement and then reads on Monday, Wednesday, next Friday.  Please avoid strenuous physical activities and do not get the patches on the back wet.  Okay to take antihistamines for itching but avoid placing any creams on the back where the patches are. No steroids for 2 weeks before.

## 2022-12-13 NOTE — Progress Notes (Addendum)
NEW PATIENT  Date of Service/Encounter:  12/13/22  Consult requested by: Ronnald Nian, MD   Subjective:   Nicholas [name] (DOB: October 04, 1955) is a 67 y.o. male who presents to the clinic on 12/13/2022 with a chief complaint of Metal testing (He will be getting a knee replacement, he was referred to check for metal test.) and Establish Care .    History obtained from: chart review and patient.  Reports having severe OA and is planning on knee replacement.  Was referred by Ortho for patch testing.   Reports with exposure to jewelry- rings, his skin turned red.  Mostly avoids jewelry now.  Never developed itchy rash.  Hx of nickel allergy in younger sister.    Past Medical History: Past Medical History:  Diagnosis Date   ADD (attention deficit disorder)    Complication of anesthesia    woke up on table x 3   DDD (degenerative disc disease), cervical 04/06/2002   Diverticulosis    Fibromyalgia    Hemorrhoids    Pancreatic alpha-amylase deficiency (HCC)    Pneumonia     Past Surgical History: Past Surgical History:  Procedure Laterality Date   ight shoulder rotator cuff surgery      inguinal hernia surgery      LAMINECTOMY Bilateral    decompression od L3-L4, L4-L5 with bilateral foraminotomies via L4 laminectomy with undercutting L3 and L5 lamina.   left knee surgery x 3      LUMBAR DISC SURGERY Left    Left L3-L4 diskectomy   NASAL SINUS SURGERY     right knee surgery x 2       Family History: Family History  Problem Relation Age of Onset   Arthritis Mother    Arthritis Maternal Grandmother    Heart disease Neg Hx    Hyperlipidemia Neg Hx    Hypertension Neg Hx    Stroke Neg Hx    Cancer Neg Hx    Neuromuscular disorder Neg Hx     Medication List:  Allergies as of 12/13/2022       Reactions   Amoxicillin-pot Clavulanate Diarrhea, Nausea And Vomiting   Lactose Intolerance (gi)    Latex Itching        Medication List        Accurate as of December 13, 2022  2:02 PM. If you have any questions, ask your nurse or doctor.          amphetamine-dextroamphetamine 30 MG 24 hr capsule Commonly known as: ADDERALL XR Take 1 capsule (30 mg total) by mouth every morning.   b complex vitamins capsule Take 1 capsule by mouth in the morning.   Cannabidiol Powd Apply 1 Application topically 3 (three) times daily as needed (knee/back pain.). CBD gel   CHLORPHENIRAMINE MALEATE PO Take 1 tablet by mouth in the morning.   diclofenac 75 MG EC tablet Commonly known as: VOLTAREN Take 1 tablet (75 mg total) by mouth 2 (two) times daily.   diclofenac sodium 1 % Gel Commonly known as: VOLTAREN Apply topically 4 (four) times daily.   multivitamin with minerals Tabs tablet Take 1 tablet by mouth in the morning.   Oxycodone HCl 10 MG Tabs Take 10 mg by mouth 3 (three) times daily as needed (pain).   traZODone 50 MG tablet Commonly known as: DESYREL Take 50 mg by mouth at bedtime and may repeat dose one time if needed.   vitamin C 1000 MG tablet Take 1,000 mg by mouth  in the morning.   zinc sulfate 220 (50 Zn) MG capsule Take 220 mg by mouth in the morning.         REVIEW OF SYSTEMS: Pertinent positives and negatives discussed in HPI.   Objective:   Physical Exam: BP 118/68   Pulse 72   Temp 97.6 F (36.4 C) (Temporal)   Ht 5' 9.09" (1.755 m)   Wt 158 lb 4.8 oz (71.8 kg)   SpO2 96%   BMI 23.31 kg/m  Body mass index is 23.31 kg/m. GEN: alert, well developed HEENT: clear conjunctiva, MMM HEART: regular rate and rhythm, no murmur LUNGS: clear to auscultation bilaterally, no coughing, unlabored respiration ABDOMEN: soft, non distended  SKIN: no rashes or lesions  Reviewed:  04/30/2022: seen by Dr Lequita Halt for OA of L knee joint.  Functional limitation.  Tried injections.  Referred to allergy for metal testing.   11/16/2022: seen by Dr Susann Givens PCP; plan for L TKR.  No significant PMH.  Does have low back pain and OA.     Assessment:   1. Allergic contact dermatitis due to metals     Plan/Recommendations:   Concern for contact dermatitis Discussed with patient that patch testing tests for contact dermatitis sometimes does not correlate to how one will react to metals in the body. Positive patch testing results can help in avoiding those items; however, it is possible to get false negative results.  Nevertheless, this is the most accessible test for metal sensitivity currently available.  There is no definitive test available to rule out a metal allergy.  Please schedule for patch testing in our [facility] office - will need to come Friday for patch placement and then reads on Monday, Wednesday, next Friday.  Please avoid strenuous physical activities and do not get the patches on the back wet.  Okay to take antihistamines for itching but avoid placing any creams on the back where the patches are. No steroids for 2 weeks before.  Metal patch testing only.     No follow-ups on file.  Alesia Morin, MD [facility]

## 2022-12-14 ENCOUNTER — Encounter (HOSPITAL_COMMUNITY): Payer: Self-pay

## 2022-12-14 NOTE — Anesthesia Preprocedure Evaluation (Addendum)
Anesthesia Evaluation  Patient identified by MRN, date of birth, ID band Patient awake    Reviewed: Allergy & Precautions, NPO status , Patient's Chart, lab work & pertinent test results  History of Anesthesia Complications (+) AWARENESS UNDER ANESTHESIA and history of anesthetic complications  Airway Mallampati: II  TM Distance: >3 FB Neck ROM: Full   Comment: Previous grade IIb view with MAC 3 Dental  (+) Dental Advisory Given   Pulmonary neg pulmonary ROS, neg shortness of breath, neg sleep apnea, neg COPD, neg recent URI   Pulmonary exam normal breath sounds clear to auscultation       Cardiovascular (-) angina (-) Past MI, (-) Cardiac Stents and (-) CABG negative cardio ROS (-) dysrhythmias  Rhythm:Regular Rate:Normal     Neuro/Psych neg Seizures PSYCHIATRIC DISORDERS (ADD)       Neuromuscular disease (lumbar radiculopathy)    GI/Hepatic negative GI ROS, Neg liver ROS,,,Pancreatic alpha-amylase deficiency   Endo/Other  negative endocrine ROS    Renal/GU negative Renal ROS  negative genitourinary   Musculoskeletal  (+) Arthritis , Osteoarthritis,  Fibromyalgia -, narcotic dependent2016 L3 Laminectomy, L4 Laminectomy, Left L3-4 discectomy, L3-4, L4-5 bilateral foraminotomy Oxy 10mg    Abdominal   Peds  Hematology negative hematology ROS (+) Hb 13.6, plt 234   Anesthesia Other Findings   Reproductive/Obstetrics negative OB ROS                             Anesthesia Physical Anesthesia Plan  ASA: 2  Anesthesia Plan: MAC, Regional and Spinal   Post-op Pain Management: Tylenol PO (pre-op)* and Regional block*   Induction: Intravenous  PONV Risk Score and Plan: 1 and Ondansetron, Dexamethasone, Propofol infusion, TIVA and Treatment may vary due to age or medical condition  Airway Management Planned: Natural Airway and Simple Face Mask  Additional Equipment:   Intra-op Plan:    Post-operative Plan:   Informed Consent: I have reviewed the patients History and Physical, chart, labs and discussed the procedure including the risks, benefits and alternatives for the proposed anesthesia with the patient or authorized representative who has indicated his/her understanding and acceptance.     Dental advisory given and History available from chart only  Plan Discussed with: CRNA and Anesthesiologist  Anesthesia Plan Comments: (Prior complication: Intra-op awareness. Medical clearance from PCP in chart  Discussed potential risks of nerve blocks including, but not limited to, infection, bleeding, nerve damage, seizures, pneumothorax, respiratory depression, and potential failure of the block. Alternatives to nerve blocks discussed. All questions answered.  I have discussed risks of neuraxial anesthesia including but not limited to infection, bleeding, nerve injury, back pain, headache, seizures, and failure of block. Patient denies bleeding disorders and is not currently anticoagulated. Labs have been reviewed. Risks and benefits discussed. All patient's questions answered.   Discussed with patient risks of MAC including, but not limited to, minor pain or discomfort, hearing people in the room, and possible need for backup general anesthesia. Risks for general anesthesia also discussed including, but not limited to, sore throat, hoarse voice, chipped/damaged teeth, injury to vocal cords, nausea and vomiting, allergic reactions, lung infection, heart attack, stroke, and death. All questions answered.  )        Anesthesia Quick Evaluation

## 2022-12-15 ENCOUNTER — Other Ambulatory Visit: Payer: Self-pay

## 2022-12-15 ENCOUNTER — Encounter: Payer: Self-pay | Admitting: Family

## 2022-12-15 ENCOUNTER — Ambulatory Visit: Payer: PPO | Admitting: Family

## 2022-12-15 VITALS — BP 140/70 | HR 77 | Temp 97.2°F | Resp 16

## 2022-12-15 DIAGNOSIS — L230 Allergic contact dermatitis due to metals: Secondary | ICD-10-CM

## 2022-12-15 NOTE — Patient Instructions (Signed)
Concern for contact dermatitis Discussed with patient that patch testing tests for contact dermatitis sometimes does not correlate to how one will react to metals in the body. Positive patch testing results can help in avoiding those items; however, it is possible to get false negative results.  Nevertheless, this is the most accessible test for metal sensitivity currently available.  There is no test available to rule out a metal allergy.   Will have patch test readings on Monday, Wednesday,  Friday.  Please avoid strenuous physical activities and do not get the patches on the back wet.  Okay to take antihistamines for itching but avoid placing any creams on the back where the patches are. No steroids for 2 weeks before.  Please contact your orthopedic surgeon for further instructions about needing to start Hibiclens on 12/21/22 from the neck down. You will still have one final reading on 12/22/22 and we do not want anything on your back besides the patches between now and then.

## 2022-12-15 NOTE — Progress Notes (Signed)
Follow-up Note  RE: DIYARI CHERNE MRN: [id number] DOB: 1955/05/01 Date of Office Visit: 12/15/2022  Primary care provider: Ronnald Nian, MD Referring provider: Ronnald Nian, MD   Shivan returns to the office today for the patch test placement, given suspected history of contact dermatitis.    Diagnostics: metal patches placed.    Plan:    Concern for contact dermatitis Discussed with patient that patch testing tests for contact dermatitis sometimes does not correlate to how one will react to metals in the body. Positive patch testing results can help in avoiding those items; however, it is possible to get false negative results.  Nevertheless, this is the most accessible test for metal sensitivity currently available.  There is no test available to rule out a metal allergy.   Will have patch test readings on Monday, Wednesday,  Friday.  Please avoid strenuous physical activities and do not get the patches on the back wet.  Okay to take antihistamines for itching but avoid placing any creams on the back where the patches are. No steroids for 2 weeks before.  Please contact your orthopedic surgeon for further instructions about needing to start Hibiclens on 12/21/22 from the neck down. You will still have one final reading on 12/22/22 and we do not want anything on your back besides the patches between now and then.  Instructed by my CMA that his blood pressure was elevated today in the office. Instructed to notify primary care physician to discuss.  Nehemiah Settle, FNP [facility]

## 2022-12-18 ENCOUNTER — Ambulatory Visit: Payer: PPO | Admitting: Allergy & Immunology

## 2022-12-18 ENCOUNTER — Encounter: Payer: Self-pay | Admitting: Allergy & Immunology

## 2022-12-18 VITALS — Temp 98.5°F

## 2022-12-18 DIAGNOSIS — L230 Allergic contact dermatitis due to metals: Secondary | ICD-10-CM

## 2022-12-18 NOTE — Patient Instructions (Addendum)
1. Allergic contact dermatitis due to metals - Testing was reactive to nickel.  - Everything else was negative. - We may find more at future readings.   2. Follow up in  2 days as scheduled.    Please inform us of any Emergency Department visits, hospitalizations, or changes in symptoms. Call us before going to the ED for breathing or allergy symptoms since we might be able to fit you in for a sick visit. Feel free to contact us anytime with any questions, problems, or concerns.  It was a pleasure to meet you today!  Websites that have reliable patient information: 1. American Academy of Asthma, Allergy, and Immunology: www.aaaai.org 2. Food Allergy Research and Education (FARE): foodallergy.org 3. Mothers of Asthmatics: http://www.asthmacommunitynetwork.org 4. American College of Allergy, Asthma, and Immunology: www.acaai.org   COVID-19 Vaccine Information can be found at: PodExchange.nl For questions related to vaccine distribution or appointments, please email [email or web address] or call [phone].     "Like" [place] on Facebook and Instagram for our latest updates!      A healthy democracy works best when [employer] participate! Make sure you are registered to vote! If you have moved or changed any of your contact information, you will need to get this updated before voting! Scan the QR codes below to learn more!

## 2022-12-18 NOTE — Progress Notes (Signed)
Follow-up Note  RE: Nicholas [name] MRN: [id number] DOB: 1955/05/16 Date of Office Visit: 12/18/2022  Primary care provider: Ronnald Nian, MD Referring provider: Ronnald Nian, MD   [name] returns to the office today for the initial patch test interpretation, given suspected history of contact dermatitis.    Diagnostics:   TRUE TEST  72-hour  reading: 2+ reaction to NICKEL, otherwise negative to the remaining metals.   Plan:   Allergic contact dermatitis - The patient has been provided detailed information regarding the substances she is sensitive to, as well as products containing the substances.   - Meticulous avoidance of these substances is recommended.  - We will send a note to Dr. Despina Hick once he has completed patch testing.   [name], MD  [facility]

## 2022-12-19 ENCOUNTER — Telehealth: Payer: Self-pay | Admitting: Family Medicine

## 2022-12-19 ENCOUNTER — Ambulatory Visit: Payer: PPO | Admitting: Allergy & Immunology

## 2022-12-19 DIAGNOSIS — M1990 Unspecified osteoarthritis, unspecified site: Secondary | ICD-10-CM

## 2022-12-19 DIAGNOSIS — G8929 Other chronic pain: Secondary | ICD-10-CM

## 2022-12-19 MED ORDER — DICLOFENAC SODIUM 75 MG PO TBEC
75.0000 mg | DELAYED_RELEASE_TABLET | Freq: Two times a day (BID) | ORAL | 1 refills | Status: DC
Start: 2022-12-19 — End: 2022-12-29

## 2022-12-19 NOTE — Telephone Encounter (Signed)
Pt called requesting refill on diclofenac  for his back issues  [facility]

## 2022-12-20 ENCOUNTER — Ambulatory Visit (INDEPENDENT_AMBULATORY_CARE_PROVIDER_SITE_OTHER): Payer: PPO | Admitting: Internal Medicine

## 2022-12-20 DIAGNOSIS — L230 Allergic contact dermatitis due to metals: Secondary | ICD-10-CM

## 2022-12-20 NOTE — Progress Notes (Signed)
Follow Up Note  RE: KEDARIUS VERBA MRN: [id number] DOB: 1956/02/26 Date of Office Visit: 12/20/2022  Referring provider: Ronnald Nian, MD Primary care provider: Ronnald Nian, MD  History of Present Illness: I had the pleasure of seeing Rydell Wender for a follow up visit at the [facility] on 12/20/2022. He is a 67 y.o. male, who is being followed for contact dermatitis. Today he is here for  second patch test interpretation , given suspected history of contact dermatitis to metals.   Diagnostics:  Metal patch test: positive 2+ to nickel    Assessment and Plan: Maddex is a 67 y.o. male with: Concern for Contact Dermatitis:  The patient has been provided detailed information regarding the substances he is sensitive to, as well as products containing the substances.  Meticulous avoidance of these substances is recommended. If avoidance is not possible, the use of barrier creams or lotions is recommended. If symptoms persist or progress despite meticulous avoidance of chemicals/substances above, dermatology evaluation may be warranted. No follow-ups on file.  It was my pleasure to see Brach today and participate in his care. Please feel free to contact me with any questions or concerns.  Sincerely,   Alesia Morin, MD [facility]

## 2022-12-22 ENCOUNTER — Ambulatory Visit (INDEPENDENT_AMBULATORY_CARE_PROVIDER_SITE_OTHER): Payer: PPO | Admitting: Internal Medicine

## 2022-12-22 DIAGNOSIS — L230 Allergic contact dermatitis due to metals: Secondary | ICD-10-CM

## 2022-12-22 NOTE — Progress Notes (Signed)
Follow Up Note  RE: Nicholas [name] MRN: [id number] DOB: 1956/03/01 Date of Office Visit: 12/22/2022  Referring provider: Ronnald Nian, MD Primary care provider: Ronnald Nian, MD  History of Present Illness: I had the pleasure of seeing Nicholas [name] for a follow up visit at the [facility] on 12/22/2022. He is a 67 y.o. male, who is being followed for final patch interpretation. Today he is here for final patch test interpretation, given suspected history of contact dermatitis.   Diagnostics:  Metal patch test read Day 8 read.  Positive to Nickel.   Metals Patch     Time Antigen Placed      Location     Number of Test     Reading Interval  Day 8- final read     Chromium chloride 1%  0     Cobalt chloride hexahydrate 1%  0     Molybdenum chloride 0.5%  0     Nickel sulfate hexahydrate 5%  2+     Potassium dichromate 0.25%  0     Copper sulfate pentahydrate 2%  0     Titanium 0.1%  0     Manganese chloride 0.5%  0     Tantal 1%  0    Vanadium pentoxide 10%  0   Aluminum hydroxide 10%  0                Assessment and Plan: Nicholas [name] is a 67 y.o. male with: Concern for contact dermatitis Discussed with patient that patch testing tests for metal contact dermatitis- sometimes it does not correlate to how one will react to metals in the body. Positive patch testing results can help in avoiding those items; however, it is possible to get false negative results.  Nevertheless, this is the most accessible test for metal sensitivity currently available.  Did have reactivity to nickel.  Will send results to his surgeon.   It was my pleasure to see Nicholas [name] today and participate in his care. Please feel free to contact me with any questions or concerns.  Sincerely,   Alesia Morin, MD [facility]

## 2022-12-25 ENCOUNTER — Ambulatory Visit (HOSPITAL_BASED_OUTPATIENT_CLINIC_OR_DEPARTMENT_OTHER): Payer: PPO | Admitting: Anesthesiology

## 2022-12-25 ENCOUNTER — Inpatient Hospital Stay (HOSPITAL_COMMUNITY)
Admission: RE | Admit: 2022-12-25 | Discharge: 2022-12-29 | DRG: 470 | Disposition: A | Discharge status: Home / Self Care | Payer: PPO | Source: Ambulatory Visit | Attending: Orthopedic Surgery | Admitting: Orthopedic Surgery

## 2022-12-25 ENCOUNTER — Other Ambulatory Visit: Payer: Self-pay

## 2022-12-25 ENCOUNTER — Encounter (HOSPITAL_COMMUNITY): Payer: Self-pay | Admitting: Orthopedic Surgery

## 2022-12-25 ENCOUNTER — Ambulatory Visit (HOSPITAL_COMMUNITY): Payer: PPO | Admitting: Medical

## 2022-12-25 ENCOUNTER — Encounter (HOSPITAL_COMMUNITY)
Admission: RE | Disposition: A | Discharge status: Home / Self Care | Payer: Self-pay | Source: Ambulatory Visit | Attending: Orthopedic Surgery

## 2022-12-25 DIAGNOSIS — Z880 Allergy status to penicillin: Secondary | ICD-10-CM

## 2022-12-25 DIAGNOSIS — Z79891 Long term (current) use of opiate analgesic: Secondary | ICD-10-CM

## 2022-12-25 DIAGNOSIS — Z8261 Family history of arthritis: Secondary | ICD-10-CM

## 2022-12-25 DIAGNOSIS — Z79899 Other long term (current) drug therapy: Secondary | ICD-10-CM

## 2022-12-25 DIAGNOSIS — M5030 Other cervical disc degeneration, unspecified cervical region: Secondary | ICD-10-CM | POA: Diagnosis present

## 2022-12-25 DIAGNOSIS — M47816 Spondylosis without myelopathy or radiculopathy, lumbar region: Secondary | ICD-10-CM | POA: Diagnosis present

## 2022-12-25 DIAGNOSIS — F988 Other specified behavioral and emotional disorders with onset usually occurring in childhood and adolescence: Secondary | ICD-10-CM | POA: Diagnosis present

## 2022-12-25 DIAGNOSIS — G8918 Other acute postprocedural pain: Secondary | ICD-10-CM | POA: Diagnosis not present

## 2022-12-25 DIAGNOSIS — Z96652 Presence of left artificial knee joint: Secondary | ICD-10-CM | POA: Diagnosis present

## 2022-12-25 DIAGNOSIS — Z01818 Encounter for other preprocedural examination: Secondary | ICD-10-CM

## 2022-12-25 DIAGNOSIS — M1712 Unilateral primary osteoarthritis, left knee: Secondary | ICD-10-CM

## 2022-12-25 DIAGNOSIS — Z8616 Personal history of COVID-19: Secondary | ICD-10-CM

## 2022-12-25 DIAGNOSIS — M797 Fibromyalgia: Secondary | ICD-10-CM | POA: Diagnosis present

## 2022-12-25 DIAGNOSIS — M25461 Effusion, right knee: Secondary | ICD-10-CM | POA: Diagnosis not present

## 2022-12-25 DIAGNOSIS — E739 Lactose intolerance, unspecified: Secondary | ICD-10-CM | POA: Diagnosis present

## 2022-12-25 DIAGNOSIS — G8929 Other chronic pain: Secondary | ICD-10-CM | POA: Diagnosis present

## 2022-12-25 DIAGNOSIS — Z91040 Latex allergy status: Secondary | ICD-10-CM

## 2022-12-25 HISTORY — PX: TOTAL KNEE ARTHROPLASTY: SHX125

## 2022-12-25 SURGERY — ARTHROPLASTY, KNEE, TOTAL
Anesthesia: Monitor Anesthesia Care | Site: Knee | Laterality: Left

## 2022-12-25 MED ORDER — MIDAZOLAM HCL 2 MG/2ML IJ SOLN
1.0000 mg | INTRAMUSCULAR | Status: DC
  Administered 2022-12-25 (×2): 2 mg via INTRAVENOUS
  Filled 2022-12-25: qty 2

## 2022-12-25 MED ORDER — AMPHETAMINE-DEXTROAMPHET ER 30 MG PO CP24: 30.0000 mg | ORAL_CAPSULE | ORAL | Status: DC

## 2022-12-25 MED ORDER — DOCUSATE SODIUM 100 MG PO CAPS
100.0000 mg | ORAL_CAPSULE | Freq: Two times a day (BID) | ORAL | Status: DC
  Administered 2022-12-25 – 2022-12-29 (×7): 100 mg via ORAL
  Filled 2022-12-25 (×8): qty 1

## 2022-12-25 MED ORDER — ONDANSETRON HCL 4 MG/2ML IJ SOLN
INTRAMUSCULAR | Status: AC
  Filled 2022-12-25: qty 2

## 2022-12-25 MED ORDER — OXYCODONE HCL 10 MG PO TABS: 10.0000 mg | ORAL_TABLET | Freq: Three times a day (TID) | ORAL | Status: DC | PRN

## 2022-12-25 MED ORDER — BUPIVACAINE LIPOSOME 1.3 % IJ SUSP
INTRAMUSCULAR | Status: AC
  Filled 2022-12-25: qty 20

## 2022-12-25 MED ORDER — POVIDONE-IODINE 10 % EX SWAB: 2.0000 | Freq: Once | CUTANEOUS | Status: DC

## 2022-12-25 MED ORDER — PHENYLEPHRINE HCL-NACL 20-0.9 MG/250ML-% IV SOLN
INTRAVENOUS | Status: AC
  Filled 2022-12-25: qty 250

## 2022-12-25 MED ORDER — ASPIRIN 81 MG PO CHEW
81.0000 mg | CHEWABLE_TABLET | Freq: Two times a day (BID) | ORAL | Status: DC
  Administered 2022-12-26 – 2022-12-29 (×6): 81 mg via ORAL
  Filled 2022-12-25 (×7): qty 1

## 2022-12-25 MED ORDER — SODIUM CHLORIDE 0.9 % IV SOLN
INTRAVENOUS | Status: DC | PRN
  Administered 2022-12-25: 80 mL

## 2022-12-25 MED ORDER — OXYCODONE HCL 5 MG/5ML PO SOLN: 5.0000 mg | Freq: Once | ORAL | Status: DC | PRN

## 2022-12-25 MED ORDER — ACETAMINOPHEN 10 MG/ML IV SOLN
1000.0000 mg | Freq: Four times a day (QID) | INTRAVENOUS | Status: DC
  Administered 2022-12-25: 1000 mg via INTRAVENOUS
  Filled 2022-12-25: qty 100

## 2022-12-25 MED ORDER — EPHEDRINE SULFATE-NACL 50-0.9 MG/10ML-% IV SOSY
PREFILLED_SYRINGE | INTRAVENOUS | Status: DC | PRN
  Administered 2022-12-25 (×2): 7.5 mg via INTRAVENOUS

## 2022-12-25 MED ORDER — CEFAZOLIN SODIUM-DEXTROSE 2-4 GM/100ML-% IV SOLN
2.0000 g | Freq: Four times a day (QID) | INTRAVENOUS | Status: AC
  Administered 2022-12-25 (×2): 2 g via INTRAVENOUS
  Filled 2022-12-25 (×2): qty 100

## 2022-12-25 MED ORDER — DEXAMETHASONE SODIUM PHOSPHATE 10 MG/ML IJ SOLN
8.0000 mg | Freq: Once | INTRAMUSCULAR | Status: AC
  Administered 2022-12-25: 8 mg via INTRAVENOUS

## 2022-12-25 MED ORDER — MENTHOL 3 MG MT LOZG: 1.0000 | LOZENGE | OROMUCOSAL | Status: DC | PRN

## 2022-12-25 MED ORDER — POLYETHYLENE GLYCOL 3350 17 G PO PACK: 17.0000 g | PACK | Freq: Every day | ORAL | Status: DC | PRN

## 2022-12-25 MED ORDER — DIPHENHYDRAMINE HCL 12.5 MG/5ML PO ELIX
12.5000 mg | ORAL_SOLUTION | ORAL | Status: DC | PRN
  Administered 2022-12-25: 12.5 mg via ORAL
  Administered 2022-12-25: 25 mg via ORAL
  Filled 2022-12-25: qty 5
  Filled 2022-12-25 (×2): qty 10

## 2022-12-25 MED ORDER — PROPOFOL 500 MG/50ML IV EMUL
INTRAVENOUS | Status: AC
  Filled 2022-12-25: qty 50

## 2022-12-25 MED ORDER — DEXAMETHASONE SODIUM PHOSPHATE 10 MG/ML IJ SOLN
10.0000 mg | Freq: Once | INTRAMUSCULAR | Status: AC
  Administered 2022-12-26: 10 mg via INTRAVENOUS
  Filled 2022-12-25: qty 1

## 2022-12-25 MED ORDER — 0.9 % SODIUM CHLORIDE (POUR BTL) OPTIME
TOPICAL | Status: DC | PRN
  Administered 2022-12-25: 1000 mL

## 2022-12-25 MED ORDER — DEXAMETHASONE SODIUM PHOSPHATE 10 MG/ML IJ SOLN
INTRAMUSCULAR | Status: AC
  Filled 2022-12-25: qty 1

## 2022-12-25 MED ORDER — PHENYLEPHRINE HCL (PRESSORS) 10 MG/ML IV SOLN
INTRAVENOUS | Status: DC | PRN
  Administered 2022-12-25: 80 ug via INTRAVENOUS

## 2022-12-25 MED ORDER — ROPIVACAINE HCL 5 MG/ML IJ SOLN
INTRAMUSCULAR | Status: DC | PRN
Start: 2022-12-25 — End: 2022-12-25
  Administered 2022-12-25: 20 mL via PERINEURAL

## 2022-12-25 MED ORDER — OXYCODONE HCL 5 MG PO TABS
10.0000 mg | ORAL_TABLET | Freq: Three times a day (TID) | ORAL | Status: DC
  Administered 2022-12-25 – 2022-12-29 (×13): 10 mg via ORAL
  Filled 2022-12-25 (×13): qty 2

## 2022-12-25 MED ORDER — TRANEXAMIC ACID-NACL 1000-0.7 MG/100ML-% IV SOLN
1000.0000 mg | INTRAVENOUS | Status: AC
  Administered 2022-12-25: 1000 mg via INTRAVENOUS
  Filled 2022-12-25: qty 100

## 2022-12-25 MED ORDER — HYDROMORPHONE HCL 2 MG PO TABS
2.0000 mg | ORAL_TABLET | ORAL | Status: DC | PRN
  Administered 2022-12-26: 4 mg via ORAL
  Administered 2022-12-26: 2 mg via ORAL
  Administered 2022-12-26 – 2022-12-27 (×3): 4 mg via ORAL
  Filled 2022-12-25: qty 2
  Filled 2022-12-25: qty 1
  Filled 2022-12-25 (×3): qty 2

## 2022-12-25 MED ORDER — OXYCODONE HCL 5 MG PO TABS
10.0000 mg | ORAL_TABLET | ORAL | Status: DC | PRN
Start: 2022-12-25 — End: 2022-12-25
  Administered 2022-12-25: 15 mg via ORAL
  Filled 2022-12-25: qty 3

## 2022-12-25 MED ORDER — SODIUM CHLORIDE (PF) 0.9 % IJ SOLN
INTRAMUSCULAR | Status: AC
  Filled 2022-12-25: qty 10

## 2022-12-25 MED ORDER — HYDROMORPHONE HCL 1 MG/ML IJ SOLN
0.5000 mg | INTRAMUSCULAR | Status: DC | PRN
  Administered 2022-12-25: 1 mg via INTRAVENOUS
  Filled 2022-12-25: qty 1

## 2022-12-25 MED ORDER — SODIUM CHLORIDE (PF) 0.9 % IJ SOLN
INTRAMUSCULAR | Status: AC
  Filled 2022-12-25: qty 50

## 2022-12-25 MED ORDER — GABAPENTIN 300 MG PO CAPS
300.0000 mg | ORAL_CAPSULE | Freq: Three times a day (TID) | ORAL | Status: DC
  Administered 2022-12-25 – 2022-12-29 (×13): 300 mg via ORAL
  Filled 2022-12-25 (×13): qty 1

## 2022-12-25 MED ORDER — CEFAZOLIN SODIUM-DEXTROSE 2-4 GM/100ML-% IV SOLN
2.0000 g | INTRAVENOUS | Status: AC
  Administered 2022-12-25: 2 g via INTRAVENOUS
  Filled 2022-12-25: qty 100

## 2022-12-25 MED ORDER — PROPOFOL 1000 MG/100ML IV EMUL
INTRAVENOUS | Status: AC
  Filled 2022-12-25: qty 100

## 2022-12-25 MED ORDER — ORAL CARE MOUTH RINSE: 15.0000 mL | Freq: Once | OROMUCOSAL | Status: AC

## 2022-12-25 MED ORDER — HYDROMORPHONE HCL 1 MG/ML IJ SOLN
1.0000 mg | INTRAMUSCULAR | Status: DC | PRN
  Administered 2022-12-25 – 2022-12-26 (×4): 2 mg via INTRAVENOUS
  Administered 2022-12-26: 1 mg via INTRAVENOUS
  Administered 2022-12-26: 2 mg via INTRAVENOUS
  Administered 2022-12-27: 1 mg via INTRAVENOUS
  Filled 2022-12-25 (×2): qty 2
  Filled 2022-12-25: qty 1
  Filled 2022-12-25: qty 2
  Filled 2022-12-25: qty 1
  Filled 2022-12-25 (×2): qty 2

## 2022-12-25 MED ORDER — METHOCARBAMOL 500 MG PO TABS
500.0000 mg | ORAL_TABLET | Freq: Four times a day (QID) | ORAL | Status: DC | PRN
  Administered 2022-12-25 – 2022-12-29 (×13): 500 mg via ORAL
  Filled 2022-12-25 (×13): qty 1

## 2022-12-25 MED ORDER — SODIUM CHLORIDE 0.9 % IR SOLN
Status: DC | PRN
  Administered 2022-12-25: 1000 mL

## 2022-12-25 MED ORDER — TRAMADOL HCL 50 MG PO TABS
50.0000 mg | ORAL_TABLET | Freq: Four times a day (QID) | ORAL | Status: DC | PRN
  Administered 2022-12-25 – 2022-12-26 (×2): 100 mg via ORAL
  Filled 2022-12-25 (×2): qty 2

## 2022-12-25 MED ORDER — BISACODYL 10 MG RE SUPP: 10.0000 mg | Freq: Every day | RECTAL | Status: DC | PRN

## 2022-12-25 MED ORDER — BUPIVACAINE IN DEXTROSE 0.75-8.25 % IT SOLN
INTRATHECAL | Status: DC | PRN
  Administered 2022-12-25: 1.6 mL via INTRATHECAL

## 2022-12-25 MED ORDER — METOCLOPRAMIDE HCL 5 MG PO TABS: 5.0000 mg | ORAL_TABLET | Freq: Three times a day (TID) | ORAL | Status: DC | PRN

## 2022-12-25 MED ORDER — PHENOL 1.4 % MT LIQD: 1.0000 | OROMUCOSAL | Status: DC | PRN

## 2022-12-25 MED ORDER — OXYCODONE HCL 5 MG PO TABS: 5.0000 mg | ORAL_TABLET | Freq: Once | ORAL | Status: DC | PRN

## 2022-12-25 MED ORDER — AMISULPRIDE (ANTIEMETIC) 5 MG/2ML IV SOLN: 10.0000 mg | Freq: Once | INTRAVENOUS | Status: DC | PRN

## 2022-12-25 MED ORDER — PROPOFOL 500 MG/50ML IV EMUL
INTRAVENOUS | Status: DC | PRN
  Administered 2022-12-25: 180 ug/kg/min via INTRAVENOUS

## 2022-12-25 MED ORDER — ACETAMINOPHEN 500 MG PO TABS
1000.0000 mg | ORAL_TABLET | Freq: Four times a day (QID) | ORAL | Status: AC
  Filled 2022-12-25: qty 2

## 2022-12-25 MED ORDER — LACTATED RINGERS IV SOLN: INTRAVENOUS | Status: DC

## 2022-12-25 MED ORDER — ONDANSETRON HCL 4 MG/2ML IJ SOLN: 4.0000 mg | Freq: Four times a day (QID) | INTRAMUSCULAR | Status: DC | PRN

## 2022-12-25 MED ORDER — BUPIVACAINE LIPOSOME 1.3 % IJ SUSP: 20.0000 mL | Freq: Once | INTRAMUSCULAR | Status: DC

## 2022-12-25 MED ORDER — MIDAZOLAM HCL 2 MG/2ML IJ SOLN
INTRAMUSCULAR | Status: AC
  Filled 2022-12-25: qty 2

## 2022-12-25 MED ORDER — METOCLOPRAMIDE HCL 5 MG/ML IJ SOLN: 5.0000 mg | Freq: Three times a day (TID) | INTRAMUSCULAR | Status: DC | PRN

## 2022-12-25 MED ORDER — FENTANYL CITRATE PF 50 MCG/ML IJ SOSY
50.0000 ug | PREFILLED_SYRINGE | INTRAMUSCULAR | Status: DC
Start: 2022-12-25 — End: 2022-12-25
  Administered 2022-12-25: 50 ug via INTRAVENOUS
  Filled 2022-12-25: qty 2

## 2022-12-25 MED ORDER — FLEET ENEMA RE ENEM
1.0000 | ENEMA | Freq: Once | RECTAL | Status: DC | PRN
Start: 2022-12-25 — End: 2022-12-29

## 2022-12-25 MED ORDER — SODIUM CHLORIDE 0.9 % IV SOLN: INTRAVENOUS | Status: DC

## 2022-12-25 MED ORDER — CHLORHEXIDINE GLUCONATE 0.12 % MT SOLN
15.0000 mL | Freq: Once | OROMUCOSAL | Status: AC
  Administered 2022-12-25: 15 mL via OROMUCOSAL

## 2022-12-25 MED ORDER — PHENYLEPHRINE HCL-NACL 20-0.9 MG/250ML-% IV SOLN
INTRAVENOUS | Status: DC | PRN
  Administered 2022-12-25: 30 ug/min via INTRAVENOUS

## 2022-12-25 MED ORDER — METHOCARBAMOL 1000 MG/10ML IJ SOLN: 500.0000 mg | Freq: Four times a day (QID) | INTRAMUSCULAR | Status: DC | PRN

## 2022-12-25 MED ORDER — FENTANYL CITRATE PF 50 MCG/ML IJ SOSY: 25.0000 ug | PREFILLED_SYRINGE | INTRAMUSCULAR | Status: DC | PRN

## 2022-12-25 MED ORDER — ONDANSETRON HCL 4 MG/2ML IJ SOLN
INTRAMUSCULAR | Status: DC | PRN
  Administered 2022-12-25: 4 mg via INTRAVENOUS

## 2022-12-25 MED ORDER — MORPHINE SULFATE (PF) 2 MG/ML IV SOLN
1.0000 mg | INTRAVENOUS | Status: DC | PRN
  Administered 2022-12-25: 2 mg via INTRAVENOUS
  Administered 2022-12-25: 1 mg via INTRAVENOUS
  Filled 2022-12-25 (×2): qty 1

## 2022-12-25 MED ORDER — STERILE WATER FOR IRRIGATION IR SOLN
Status: DC | PRN
  Administered 2022-12-25: 2000 mL

## 2022-12-25 MED ORDER — ACETAMINOPHEN 500 MG PO TABS
1000.0000 mg | ORAL_TABLET | Freq: Once | ORAL | Status: AC
  Administered 2022-12-26: 1000 mg via ORAL
  Filled 2022-12-25: qty 2

## 2022-12-25 MED ORDER — ONDANSETRON HCL 4 MG PO TABS: 4.0000 mg | ORAL_TABLET | Freq: Four times a day (QID) | ORAL | Status: DC | PRN

## 2022-12-25 SURGICAL SUPPLY — 64 items
ADH SKN CLS APL DERMABOND .7 (GAUZE/BANDAGES/DRESSINGS) ×1
ARTISURF 10M VEL 10-11 EF KNEE (Knees) IMPLANT
AUG TIB EF 10 HLF BLCK STRL LM (Joint) ×1 IMPLANT
AUG TIB HALF BLOCK PS EF LM 10 (Joint) ×1 IMPLANT
AUGMENT TIB HALF BLC PS LM 10 (Joint) IMPLANT
BAG COUNTER SPONGE SURGICOUNT (BAG) IMPLANT
BAG SPEC THK2 15X12 ZIP CLS (MISCELLANEOUS) ×1
BAG SPNG CNTER NS LX DISP (BAG) ×1
BAG ZIPLOCK 12X15 (MISCELLANEOUS) ×1 IMPLANT
BLADE SAG 18X100X1.27 (BLADE) ×1 IMPLANT
BLADE SAW SGTL 11.0X1.19X90.0M (BLADE) ×1 IMPLANT
BNDG CMPR 6 X 5 YARDS HK CLSR (GAUZE/BANDAGES/DRESSINGS) ×1
BNDG ELASTIC 6INX 5YD STR LF (GAUZE/BANDAGES/DRESSINGS) ×1 IMPLANT
BOWL SMART MIX CTS (DISPOSABLE) ×1 IMPLANT
CEMENT HV SMART SET (Cement) ×2 IMPLANT
COMP FEM CMT PS HIP STD 11 LT (Joint) ×1 IMPLANT
COMPONENT FEM CMT HIP STD11LT (Joint) IMPLANT
COMPONENT PSN TIB FIX SZ F LT (Miscellaneous) IMPLANT
COVER SURGICAL LIGHT HANDLE (MISCELLANEOUS) ×1 IMPLANT
CUFF TOURN SGL QUICK 34 (TOURNIQUET CUFF) ×1
CUFF TRNQT CYL 34X4.125X (TOURNIQUET CUFF) ×1 IMPLANT
DERMABOND ADVANCED .7 DNX12 (GAUZE/BANDAGES/DRESSINGS) ×1 IMPLANT
DRAPE U-SHAPE 47X51 STRL (DRAPES) ×1 IMPLANT
DRSG AQUACEL AG ADV 3.5X10 (GAUZE/BANDAGES/DRESSINGS) ×1 IMPLANT
DURAPREP 26ML APPLICATOR (WOUND CARE) ×1 IMPLANT
ELECT REM PT RETURN 15FT ADLT (MISCELLANEOUS) ×1 IMPLANT
GLOVE BIO SURGEON STRL SZ 6.5 (GLOVE) IMPLANT
GLOVE BIO SURGEON STRL SZ8 (GLOVE) ×1 IMPLANT
GLOVE BIOGEL PI IND STRL 6.5 (GLOVE) IMPLANT
GLOVE BIOGEL PI IND STRL 7.0 (GLOVE) IMPLANT
GLOVE BIOGEL PI IND STRL 8 (GLOVE) ×1 IMPLANT
GLOVE SURG SYN 6.5 ES PF (GLOVE) ×1 IMPLANT
GLOVE SURG SYN 6.5 PF PI (GLOVE) IMPLANT
GLOVE SURG SYN 8.0 (GLOVE) ×1 IMPLANT
GLOVE SURG SYN 8.0 PF PI (GLOVE) IMPLANT
GOWN STRL REUS W/ TWL LRG LVL3 (GOWN DISPOSABLE) ×1 IMPLANT
GOWN STRL REUS W/TWL LRG LVL3 (GOWN DISPOSABLE) ×1
HANDPIECE INTERPULSE COAX TIP (DISPOSABLE) ×1
HOLDER FOLEY CATH W/STRAP (MISCELLANEOUS) IMPLANT
IMMOBILIZER KNEE 20 (SOFTGOODS) ×1
IMMOBILIZER KNEE 20 THIGH 36 (SOFTGOODS) ×1 IMPLANT
INSERT TIB PS EF/10-11 11 LT (Insert) IMPLANT
KIT TURNOVER KIT A (KITS) IMPLANT
MANIFOLD NEPTUNE II (INSTRUMENTS) ×1 IMPLANT
NS IRRIG 1000ML POUR BTL (IV SOLUTION) ×1 IMPLANT
PACK TOTAL KNEE CUSTOM (KITS) ×1 IMPLANT
PADDING CAST COTTON 6X4 STRL (CAST SUPPLIES) ×2 IMPLANT
PIN DRILL HDLS TROCAR 75 4PK (PIN) IMPLANT
PROTECTOR NERVE ULNAR (MISCELLANEOUS) ×1 IMPLANT
SCREW FEMALE HEX FIX 25X2.5 (ORTHOPEDIC DISPOSABLE SUPPLIES) IMPLANT
SET HNDPC FAN SPRY TIP SCT (DISPOSABLE) ×1 IMPLANT
STEM POLY PAT PLY 41M KNEE (Knees) IMPLANT
STEM TIB ST PERS 14+30 (Stem) IMPLANT
SUT MNCRL AB 4-0 PS2 18 (SUTURE) ×1 IMPLANT
SUT STRATAFIX 0 PDS 27 VIOLET (SUTURE) ×1
SUT VIC AB 2-0 CT1 27 (SUTURE) ×3
SUT VIC AB 2-0 CT1 TAPERPNT 27 (SUTURE) ×3 IMPLANT
SUTURE STRATFX 0 PDS 27 VIOLET (SUTURE) ×1 IMPLANT
TRAY FOL W/BAG SLVR 16FR STRL (SET/KITS/TRAYS/PACK) IMPLANT
TRAY FOLEY MTR SLVR 16FR STAT (SET/KITS/TRAYS/PACK) IMPLANT
TRAY FOLEY W/BAG SLVR 16FR LF (SET/KITS/TRAYS/PACK) ×1
TUBE SUCTION HIGH CAP CLEAR NV (SUCTIONS) ×1 IMPLANT
WATER STERILE IRR 1000ML POUR (IV SOLUTION) ×2 IMPLANT
WRAP KNEE MAXI GEL POST OP (GAUZE/BANDAGES/DRESSINGS) ×1 IMPLANT

## 2022-12-25 NOTE — Anesthesia Procedure Notes (Signed)
Spinal  Patient location during procedure: OR Start time: 12/25/2022 8:17 AM End time: 12/25/2022 8:19 AM Reason for block: surgical anesthesia Staffing Performed: anesthesiologist  Anesthesiologist: Linton Rump, MD Performed by: Linton Rump, MD Authorized by: Linton Rump, MD   Preanesthetic Checklist Completed: patient identified, IV checked, site marked, risks and benefits discussed, surgical consent, monitors and equipment checked, pre-op evaluation and timeout performed Spinal Block Patient position: sitting Prep: DuraPrep Patient monitoring: blood pressure and continuous pulse ox Approach: midline Location: L3-4 Injection technique: single-shot Needle Needle type: Pencan  Needle gauge: 24 G Needle length: 9 cm Additional Notes Risks and benefits of neuraxial anesthesia including, but not limited to, infection, bleeding, local anesthetic toxicity, headache, hypotension, back pain, block failure, etc. were discussed with the patient. The patient expressed understanding and consented to the procedure. I confirmed that the patient has no bleeding disorders and is not taking blood thinners. I confirmed the patient's last platelet count with the nurse. Monitors were applied. A time-out was performed immediately prior to the procedure. Sterile technique was used throughout the whole procedure.   1 attempt(s)

## 2022-12-25 NOTE — Anesthesia Postprocedure Evaluation (Signed)
Anesthesia Post Note  Patient: Nicholas [name]  Procedure(s) Performed: TOTAL KNEE ARTHROPLASTY (Left: Knee)     Patient location during evaluation: PACU Anesthesia Type: Regional, MAC and Spinal Level of consciousness: awake Pain management: pain level controlled Vital Signs Assessment: post-procedure vital signs reviewed and stable Respiratory status: spontaneous breathing, respiratory function stable and nonlabored ventilation Cardiovascular status: blood pressure returned to baseline and stable Postop Assessment: no headache, no backache and no apparent nausea or vomiting Anesthetic complications: no   No notable events documented.  Last Vitals:  Vitals:   12/25/22 1149 12/25/22 1401  BP: 137/84 (!) 161/95  Pulse: (!) 58 72  Resp: 16 16  Temp: 37.1 C (!) 36.4 C  SpO2: 98% 100%    Last Pain:  Vitals:   12/25/22 1455  TempSrc:   PainSc: 10-Worst pain ever                 Linton Rump

## 2022-12-25 NOTE — Anesthesia Procedure Notes (Addendum)
Anesthesia Regional Block: Adductor canal block   Pre-Anesthetic Checklist: , timeout performed,  Correct Patient, Correct Site, Correct Laterality,  Correct Procedure, Correct Position, site marked,  Risks and benefits discussed,  Surgical consent,  Pre-op evaluation,  At surgeon's request and post-op pain management  Laterality: Left  Prep: chloraprep       Needles:  Injection technique: Single-shot  Needle Type: Echogenic Stimulator Needle     Needle Length: 9cm  Needle Gauge: 21     Additional Needles:   Procedures:,,,, ultrasound used (permanent image in chart),,    Narrative:  Start time: 12/25/2022 7:53 AM End time: 12/25/2022 7:55 AM Injection made incrementally with aspirations every 5 mL.  Performed by: Personally  Anesthesiologist: Linton Rump, MD  Additional Notes: Discussed risks and benefits of nerve block including, but not limited to, prolonged and/or permanent nerve injury involving sensory and/or motor function. Monitors were applied and a time-out was performed. The nerve and associated structures were visualized under ultrasound guidance. After negative aspiration, local anesthetic was slowly injected around the nerve. There was no evidence of high pressure during the procedure. There were no paresthesias. VSS remained stable and the patient tolerated the procedure well.

## 2022-12-25 NOTE — Transfer of Care (Signed)
Immediate Anesthesia Transfer of Care Note  Patient: Nicholas [name]  Procedure(s) Performed: TOTAL KNEE ARTHROPLASTY (Left: Knee)  Patient Location: PACU  Anesthesia Type:MAC and Spinal  Level of Consciousness: drowsy  Airway & Oxygen Therapy: Patient Spontanous Breathing and Patient connected to face mask  Post-op Assessment: Report given to RN  Post vital signs: Reviewed and stable  Last Vitals:  Vitals Value Taken Time  BP 116/75 12/25/22 1026  Temp    Pulse 76 12/25/22 1030  Resp 23 12/25/22 1030  SpO2 100 % 12/25/22 1030  Vitals shown include unfiled device data.  Last Pain:  Vitals:   12/25/22 0805  TempSrc:   PainSc: 0-No pain      Patients Stated Pain Goal: 5 (12/25/22 6962)  Complications: No notable events documented.

## 2022-12-25 NOTE — Op Note (Signed)
NAME: Nicholas [name], ALGHAMDI MEDICAL RECORD NO: [id number] ACCOUNT NO: [id number] DATE OF BIRTH: [birth year] FACILITY: [facility] LOCATION: [facility] PHYSICIAN: Gus Rankin. Pyper Olexa, MD  Operative Report   DATE OF PROCEDURE: 12/25/2022  PREOPERATIVE DIAGNOSIS:  Osteoarthritis, left knee.  POSTOPERATIVE DIAGNOSIS:  Osteoarthritis, left knee.  PROCEDURE:  Left total knee arthroplasty.  SURGEON:  Gus Rankin. Indiana Gamero, MD  ASSISTANT:  Weston Brass, PA-C.  ANESTHESIA:  Adductor canal block and spinal.  ESTIMATED BLOOD LOSS:  25 mL.  DRAINS:  None.  TOURNIQUET TIME:  66 minutes at 300 mmHg.  COMPLICATIONS:  None.  CONDITION:  Stable to recovery.  BRIEF CLINICAL NOTE:  The patient is a 67 year old male who has advanced end-stage arthritis of the left knee with significant pain and dysfunction.  He has a significant varus deformity and significant proximal medial tibial erosion.  He presents now  for a total knee arthroplasty.  DESCRIPTION OF PROCEDURE:  After successful administration of adductor canal block and spinal anesthetic, a tourniquet was placed on his left thigh and left lower extremity, prepped and draped in the usual sterile fashion.  Extremity is wrapped in  Esmarch, tourniquet inflated to 300 mmHg.  Midline incision was made with a 10 blade through the subcutaneous tissue to the level of the extensor mechanism.  Fresh blade was used to make a medial parapatellar arthrotomy.  Soft tissue on the proximal  medial tibia subperiosteally elevated to the joint line with a knife and into the semimembranosus bursa with a Cobb elevator.  Soft tissue laterally was elevated with attention being paid to avoid the patellar tendon on tibial tubercle.  Patella was  everted, knee flexed to 90 degrees and ACL and PCL were removed.  Drill was used to create a starting hole in the distal femur.  Canal was thoroughly irrigated.  A 5-degree right valgus alignment guide was placed and the distal femoral  cutting block pinned to remove 10 mm off the distal femur.  Distal femoral resection  was made with an oscillating saw.  Tibia subluxed forward and the menisci removed.  He has got a massive defect on the proximal medial tibia.  I cut the normal amount of bone from the proximal tibia after placing circumferential retraction.  There was still a defect medially, so we knew  we had to cut further medially and then place a wedge to build that side up to get a stable platform.  We thus had to use a revision tibial components.  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  The size F  is the most appropriate tibial size.  We prepared the proximal tibia with the reamer and then the keel punch for that.  We then placed the block to remove 10 mm further off the medial side for a 10 mm medial wedge.  That resection was made with an  oscillating saw.  We then placed a trial, which was a size F tibial revision trial with a 10 mm medial wedge and a 30 mm stem extension.  This had great fit on the tibia.  We then addressed the femur.  Femoral sizer was placed and size 11 was most appropriate.  Rotation was marked off the epicondylar axis and the block pinned in that position.  The anterior, posterior and chamfer cuts were made.  The trial was then placed  and we made the intercondylar cut through the trial.  An 11 mm insert was placed and full  extension was achieved with excellent varus, valgus, anterior, posterior balance throughout full range of motion.  The tibial trials were removed and we palpated  posteriorly and removed any osteophytes off the posterior femur.  All trials were removed and the cut bone surfaces were prepared with pulsatile lavage.  Cement was mixed and we then put the cement in the canal and impacted the tibial component and all  extruded cement were removed.  Once again on the tibial side, it was a size F tibia with a 10 mm medial augment and a 14 x 30  stem extension.  We impacted that and removed all extruded cement.  We then cemented the size 11 femur and impacted that and  extruded cement removed.  The 11 mm trial was placed.  Knee held in full extension.  All extruded cement removed.  When the cement was fully hardened, the knee permanent 11 mm posterior stabilized fixed bearing insert was impacted into the tibial tray.   Great stability was achieved with full extension, excellent varus, valgus balance throughout full range of motion and flexion down past 90 with excellent AP balance.  That trial was removed and the permanent 11 mm insert placed.  20 mL of Exparel mixed  with 60 mL of saline was injected into the extensor mechanism, posterior capsule and the subcutaneous tissue.  The wound was copiously irrigated with saline solution and the arthrotomy closed with a running 0 Stratafix suture.  Tourniquet was released,  total time of 66 minutes.  Subcutaneous was closed with interrupted 2-0 Vicryl and subcuticular running 4-0 Monocryl.  Incisions cleaned and dried and Steri-Strips and sterile dressing applied.  He was then awakened and transported to recovery in stable  condition.  Note that a surgical assistant was of medical necessity for this procedure.  Assistance was necessary to retract vital ligaments and neurovascular structures and properly positioning the limb for safe and accurate placement of the prosthesis.   PUS D: 12/25/2022 11:06:24 am T: 12/25/2022 11:28:00 am  JOB: [id number]/ [id number]

## 2022-12-25 NOTE — Evaluation (Signed)
Physical Therapy Evaluation Patient Details Name: Nicholas [name] MRN: [id number] DOB: 02/10/1956 Today's Date: 12/25/2022  History of Present Illness  67 y.o. male admitted 12/25/22 for  L TKA. PMH: lumbar spondylosis, diverticulosis, fibromyalgia  Clinical Impression  Pt is s/p TKA resulting in the deficits listed below (see PT Problem List). Pt ambulated 50' with RW, no loss of balance, distance limited by 9/10 L knee pain. Pt reports he's "miserable from pain" despite having pain medication 1 hour  prior to PT session. He stated he took narcotics for chronic back pain prior to surgery, I suspect that is impacting pain control here. Pt puts forth good effort. Good progress expected.  Pt will benefit from acute skilled PT to increase their independence and safety with mobility to allow discharge.          If plan is discharge home, recommend the following: A little help with bathing/dressing/bathroom;Assistance with cooking/housework;Help with stairs or ramp for entrance;Assist for transportation   Can travel by private vehicle        Equipment Recommendations Rolling walker (2 wheels)  Recommendations for Other Services       Functional Status Assessment Patient has had a recent decline in their functional status and demonstrates the ability to make significant improvements in function in a reasonable and predictable amount of time.     Precautions / Restrictions Precautions Precautions: Fall;Knee Precaution Comments: reviewed no pillow under knee Restrictions Weight Bearing Restrictions: No      Mobility  Bed Mobility Overal bed mobility: Modified Independent             General bed mobility comments: HOB up, used rail    Transfers Overall transfer level: Needs assistance Equipment used: Rolling walker (2 wheels) Transfers: Sit to/from Stand Sit to Stand: Contact guard assist, From elevated surface           General transfer comment: VCs hand placement     Ambulation/Gait Ambulation/Gait assistance: Contact guard assist Gait Distance (Feet): 50 Feet Assistive device: Rolling walker (2 wheels) Gait Pattern/deviations: Step-to pattern, Trunk flexed Gait velocity: decr     General Gait Details: steady with RW, no loss of balance, VCs sequencing and posture, distance limited by pain  Stairs            Wheelchair Mobility     Tilt Bed    Modified Rankin (Stroke Patients Only)       Balance Overall balance assessment: Modified Independent                                           Pertinent Vitals/Pain Pain Assessment Pain Assessment: 0-10 Pain Score: 9  Pain Location: L knee Pain Descriptors / Indicators: Stabbing Pain Intervention(s): Limited activity within patient's tolerance, Monitored during session, Premedicated before session, Patient requesting pain meds-RN notified, Ice applied    Home Living Family/patient expects to be discharged to:: Private residence Living Arrangements: Alone Available Help at Discharge: Friend(s) Type of Home: House Home Access: Stairs to enter Entrance Stairs-Rails: Can reach both;Left;Right Entrance Stairs-Number of Steps: 7   Home Layout: One level Home Equipment: Crutches      Prior Function Prior Level of Function : Independent/Modified Independent             Mobility Comments: walked without AD PTA, denies falls in past 6 months ADLs Comments: independent     Extremity/Trunk Assessment  Upper Extremity Assessment Upper Extremity Assessment: Overall WFL for tasks assessed    Lower Extremity Assessment Lower Extremity Assessment: LLE deficits/detail LLE Deficits / Details: SLR 3/5, knee AAROM ~10-50* LLE Sensation: WNL LLE Coordination: WNL    Cervical / Trunk Assessment Cervical / Trunk Assessment: Normal  Communication   Communication Communication: No apparent difficulties  Cognition Arousal: Alert Behavior During Therapy: WFL for  tasks assessed/performed Overall Cognitive Status: Within Functional Limits for tasks assessed                                          General Comments      Exercises Total Joint Exercises Ankle Circles/Pumps: AROM, 10 reps, Both, Supine Heel Slides: AAROM, Left, 5 reps, Supine   Assessment/Plan    PT Assessment Patient needs continued PT services  PT Problem List Decreased range of motion;Decreased strength;Decreased activity tolerance;Decreased mobility;Pain;Decreased knowledge of use of DME       PT Treatment Interventions DME instruction;Gait training;Therapeutic activities;Therapeutic exercise;Patient/family education    PT Goals (Current goals can be found in the Care Plan section)  Acute Rehab PT Goals Patient Stated Goal: climb scaffolding for doing golf commentating PT Goal Formulation: With patient Time For Goal Achievement: 01/01/23 Potential to Achieve Goals: Good    Frequency 7X/week     Co-evaluation               AM-PAC PT "6 Clicks" Mobility  Outcome Measure Help needed turning from your back to your side while in a flat bed without using bedrails?: A Little Help needed moving from lying on your back to sitting on the side of a flat bed without using bedrails?: A Little Help needed moving to and from a bed to a chair (including a wheelchair)?: A Little Help needed standing up from a chair using your arms (e.g., wheelchair or bedside chair)?: A Little Help needed to walk in hospital room?: A Little Help needed climbing 3-5 steps with a railing? : A Lot 6 Click Score: 17    End of Session Equipment Utilized During Treatment: Gait belt Activity Tolerance: Patient limited by pain Patient left: in chair;with chair alarm set;with call bell/phone within reach Nurse Communication: Mobility status PT Visit Diagnosis: Difficulty in walking, not elsewhere classified (R26.2);Pain Pain - Right/Left: Left Pain - part of body: Knee     Time: 4696-2952 PT Time Calculation (min) (ACUTE ONLY): 25 min   Charges:   PT Evaluation $PT Eval Moderate Complexity: 1 Mod PT Treatments $Gait Training: 8-22 mins PT General Charges $$ ACUTE PT VISIT: 1 Visit        Tamala Ser PT 12/25/2022  Acute Rehabilitation Services  Office [phone]

## 2022-12-25 NOTE — Progress Notes (Signed)
Orthopedic Tech Progress Note Patient Details:  Nicholas [name] 13-Oct-1955 [id number]  CPM Left Knee CPM Left Knee: On Left Knee Flexion (Degrees): 40 Left Knee Extension (Degrees): 10  Post Interventions Patient Tolerated: Well Instructions Provided: Adjustment of device, Care of device  Kizzie Fantasia 12/25/2022, 10:34 AM

## 2022-12-25 NOTE — Brief Op Note (Signed)
12/25/2022  10:58 AM  PATIENT:  Danella Deis Perras  67 y.o. male  PRE-OPERATIVE DIAGNOSIS:  left knee osteoarthritis  POST-OPERATIVE DIAGNOSIS:  left knee osteoarthritis  PROCEDURE:  Procedure(s): TOTAL KNEE ARTHROPLASTY (Left)  SURGEON:  Surgeons and Role:    Ollen Gross, MD - Primary  PHYSICIAN ASSISTANT:   ASSISTANTS: Weston Brass, PA-C   ANESTHESIA:   regional and spinal  EBL:  20 mL   BLOOD ADMINISTERED:none  DRAINS: none   LOCAL MEDICATIONS USED:  OTHER Exparel  COUNTS:  YES  TOURNIQUET:   Total Tourniquet Time Documented: Thigh (Left) - 68 minutes Total: Thigh (Left) - 68 minutes   DICTATION: .Other Dictation: Dictation Number [id number]  PLAN OF CARE: Admit for overnight observation  PATIENT DISPOSITION:  PACU - hemodynamically stable.

## 2022-12-25 NOTE — Progress Notes (Signed)
Orthopedic Tech Progress Note Patient Details:  Nicholas [name] 1955-05-14 [id number]  CPM Left Knee CPM Left Knee: Off Left Knee Flexion (Degrees): 40 Left Knee Extension (Degrees): 10  Post Interventions Patient Tolerated: Well Instructions Provided: Adjustment of device, Care of device  Kizzie Fantasia 12/25/2022, 2:30 PM

## 2022-12-25 NOTE — Discharge Instructions (Signed)
 Frank Aluisio, MD Total Joint Specialist [facility] [place] [address] [place], [place] [address] [phone]  TOTAL KNEE REPLACEMENT POSTOPERATIVE DIRECTIONS    Knee Rehabilitation, Guidelines Following Surgery  Results after knee surgery are often greatly improved when you follow the exercise, range of motion and muscle strengthening exercises prescribed by your doctor. Safety measures are also important to protect the knee from further injury. If any of these exercises cause you to have increased pain or swelling in your knee joint, decrease the amount until you are comfortable again and slowly increase them. If you have problems or questions, call your caregiver or physical therapist for advice.   BLOOD CLOT PREVENTION Take 81 mg Aspirin two times a day for three weeks following surgery. Then take an 81 mg Aspirin once a day for three weeks. Then discontinue Aspirin. You may resume your vitamins/supplements upon discharge from the hospital. Do not take any NSAIDs (Advil, Aleve, Ibuprofen, Meloxicam, etc.) for 3 weeks, while taking 81mg Aspirin twice a day.   HOME CARE INSTRUCTIONS  Remove items at home which could result in a fall. This includes throw rugs or furniture in walking pathways.  ICE to the affected knee as much as tolerated. Icing helps control swelling. If the swelling is well controlled you will be more comfortable and rehab easier. Continue to use ice on the knee for pain and swelling from surgery. You may notice swelling that will progress down to the foot and ankle. This is normal after surgery. Elevate the leg when you are not up walking on it.    Continue to use the breathing machine which will help keep your temperature down. It is common for your temperature to cycle up and down following surgery, especially at night when you are not up moving around and exerting yourself. The breathing machine keeps your lungs expanded and your temperature  down. Do not place pillow under the operative knee, focus on keeping the knee straight while resting  DIET You may resume your previous home diet once you are discharged from the hospital.  DRESSING / WOUND CARE / SHOWERING Keep your bulky bandage on for 2 days. On the third post-operative day you may remove the Ace bandage and gauze. There is a waterproof adhesive bandage on your skin which will stay in place until your first follow-up appointment. Once you remove this you will not need to place another bandage You may begin showering 3 days following surgery, but do not submerge the incision under water.  ACTIVITY For the first 5 days, the key is rest and control of pain and swelling Do your home exercises twice a day starting on post-operative day 3. On the days you go to physical therapy, just do the home exercises once that day. You should rest, ice and elevate the leg for 50 minutes out of every hour. Get up and walk/stretch for 10 minutes per hour. After 5 days you can increase your activity slowly as tolerated. Walk with your walker as instructed. Use the walker until you are comfortable transitioning to a cane. Walk with the cane in the opposite hand of the operative leg. You may discontinue the cane once you are comfortable and walking steadily. Avoid periods of inactivity such as sitting longer than an hour when not asleep. This helps prevent blood clots.  You may discontinue the knee immobilizer once you are able to perform a straight leg raise while lying down. You may resume a sexual relationship in   one month or when given the OK by your doctor.  You may return to work once you are cleared by your doctor.  Do not drive a car for 6 weeks or until released by your surgeon.  Do not drive while taking narcotics.  TED HOSE STOCKINGS Wear the elastic stockings on both legs for three weeks following surgery during the day. You may remove them at night for sleeping.  WEIGHT  BEARING Weight bearing as tolerated with assist device (walker, cane, etc) as directed, use it as long as suggested by your surgeon or therapist, typically at least 4-6 weeks.  POSTOPERATIVE CONSTIPATION PROTOCOL Constipation - defined medically as fewer than three stools per week and severe constipation as less than one stool per week.  One of the most common issues patients have following surgery is constipation.  Even if you have a regular bowel pattern at home, your normal regimen is likely to be disrupted due to multiple reasons following surgery.  Combination of anesthesia, postoperative narcotics, change in appetite and fluid intake all can affect your bowels.  In order to avoid complications following surgery, here are some recommendations in order to help you during your recovery period.  Colace (docusate) - Pick up an over-the-counter form of Colace or another stool softener and take twice a day as long as you are requiring postoperative pain medications.  Take with a full glass of water daily.  If you experience loose stools or diarrhea, hold the colace until you stool forms back up. If your symptoms do not get better within 1 week or if they get worse, check with your doctor. Dulcolax (bisacodyl) - Pick up over-the-counter and take as directed by the product packaging as needed to assist with the movement of your bowels.  Take with a full glass of water.  Use this product as needed if not relieved by Colace only.  MiraLax (polyethylene glycol) - Pick up over-the-counter to have on hand. MiraLax is a solution that will increase the amount of water in your bowels to assist with bowel movements.  Take as directed and can mix with a glass of water, juice, soda, coffee, or tea. Take if you go more than two days without a movement. Do not use MiraLax more than once per day. Call your doctor if you are still constipated or irregular after using this medication for 7 days in a row.  If you continue  to have problems with postoperative constipation, please contact the office for further assistance and recommendations.  If you experience "the worst abdominal pain ever" or develop nausea or vomiting, please contact the office immediatly for further recommendations for treatment.  ITCHING If you experience itching with your medications, try taking only a single pain pill, or even half a pain pill at a time.  You can also use Benadryl over the counter for itching or also to help with sleep.   MEDICATIONS See your medication summary on the "After Visit Summary" that the nursing staff will review with you prior to discharge.  You may have some home medications which will be placed on hold until you complete the course of blood thinner medication.  It is important for you to complete the blood thinner medication as prescribed by your surgeon.  Continue your approved medications as instructed at time of discharge.  PRECAUTIONS If you experience chest pain or shortness of breath - call 911 immediately for transfer to the hospital emergency department.  If you develop a fever greater that   101 F, purulent drainage from wound, increased redness or drainage from wound, foul odor from the wound/dressing, or calf pain - CONTACT YOUR SURGEON.                                                   FOLLOW-UP APPOINTMENTS Make sure you keep all of your appointments after your operation with your surgeon and caregivers. You should call the office at the above phone number and make an appointment for approximately two weeks after the date of your surgery or on the date instructed by your surgeon outlined in the "After Visit Summary".  RANGE OF MOTION AND STRENGTHENING EXERCISES  Rehabilitation of the knee is important following a knee injury or an operation. After just a few days of immobilization, the muscles of the thigh which control the knee become weakened and shrink (atrophy). Knee exercises are designed to build up  the tone and strength of the thigh muscles and to improve knee motion. Often times heat used for twenty to thirty minutes before working out will loosen up your tissues and help with improving the range of motion but do not use heat for the first two weeks following surgery. These exercises can be done on a training (exercise) mat, on the floor, on a table or on a bed. Use what ever works the best and is most comfortable for you Knee exercises include:  Leg Lifts - While your knee is still immobilized in a splint or cast, you can do straight leg raises. Lift the leg to 60 degrees, hold for 3 sec, and slowly lower the leg. Repeat 10-20 times 2-3 times daily. Perform this exercise against resistance later as your knee gets better.  Quad and Hamstring Sets - Tighten up the muscle on the front of the thigh (Quad) and hold for 5-10 sec. Repeat this 10-20 times hourly. Hamstring sets are done by pushing the foot backward against an object and holding for 5-10 sec. Repeat as with quad sets.  Leg Slides: Lying on your back, slowly slide your foot toward your buttocks, bending your knee up off the floor (only go as far as is comfortable). Then slowly slide your foot back down until your leg is flat on the floor again. Angel Wings: Lying on your back spread your legs to the side as far apart as you can without causing discomfort.  A rehabilitation program following serious knee injuries can speed recovery and prevent re-injury in the future due to weakened muscles. Contact your doctor or a physical therapist for more information on knee rehabilitation.   POST-OPERATIVE OPIOID TAPER INSTRUCTIONS: It is important to wean off of your opioid medication as soon as possible. If you do not need pain medication after your surgery it is ok to stop day one. Opioids include: Codeine, Hydrocodone(Norco, Vicodin), Oxycodone(Percocet, oxycontin) and hydromorphone amongst others.  Long term and even short term use of opiods can  cause: Increased pain response Dependence Constipation Depression Respiratory depression And more.  Withdrawal symptoms can include Flu like symptoms Nausea, vomiting And more Techniques to manage these symptoms Hydrate well Eat regular healthy meals Stay active Use relaxation techniques(deep breathing, meditating, yoga) Do Not substitute Alcohol to help with tapering If you have been on opioids for less than two weeks and do not have pain than it is ok to stop all together.    Plan to wean off of opioids This plan should start within one week post op of your joint replacement. Maintain the same interval or time between taking each dose and first decrease the dose.  Cut the total daily intake of opioids by one tablet each day Next start to increase the time between doses. The last dose that should be eliminated is the evening dose.   IF YOU ARE TRANSFERRED TO A SKILLED REHAB FACILITY If the patient is transferred to a skilled rehab facility following release from the hospital, a list of the current medications will be sent to the facility for the patient to continue.  When discharged from the skilled rehab facility, please have the facility set up the patient's Home Health Physical Therapy prior to being released. Also, the skilled facility will be responsible for providing the patient with their medications at time of release from the facility to include their pain medication, the muscle relaxants, and their blood thinner medication. If the patient is still at the rehab facility at time of the two week follow up appointment, the skilled rehab facility will also need to assist the patient in arranging follow up appointment in our office and any transportation needs.  MAKE SURE YOU:  Understand these instructions.  Get help right away if you are not doing well or get worse.   DENTAL ANTIBIOTICS:  In most cases prophylactic antibiotics for Dental procdeures after total joint surgery are  not necessary.  Exceptions are as follows:  1. History of prior total joint infection  2. Severely immunocompromised (Organ Transplant, cancer chemotherapy, Rheumatoid biologic meds such as Humera)  3. Poorly controlled diabetes (A1C &gt; 8.0, blood glucose over 200)  If you have one of these conditions, contact your surgeon for an antibiotic prescription, prior to your dental procedure.    Pick up stool softner and laxative for home use following surgery while on pain medications. Do not submerge incision under water. Please use good hand washing techniques while changing dressing each day. May shower starting three days after surgery. Please use a clean towel to pat the incision dry following showers. Continue to use ice for pain and swelling after surgery. Do not use any lotions or creams on the incision until instructed by your surgeon.  

## 2022-12-25 NOTE — Interval H&P Note (Signed)
History and Physical Interval Note:  12/25/2022 6:32 AM  Nicholas [name]  has presented today for surgery, with the diagnosis of left knee osteoarthritis.  The various methods of treatment have been discussed with the patient and family. After consideration of risks, benefits and other options for treatment, the patient has consented to  Procedure(s): TOTAL KNEE ARTHROPLASTY (Left) as a surgical intervention.  The patient's history has been reviewed, patient examined, no change in status, stable for surgery.  I have reviewed the patient's chart and labs.  Questions were answered to the patient's satisfaction.     Homero Fellers Naba Sneed

## 2022-12-26 ENCOUNTER — Encounter (HOSPITAL_COMMUNITY): Payer: Self-pay | Admitting: Orthopedic Surgery

## 2022-12-26 LAB — BASIC METABOLIC PANEL
Anion gap: 7 (ref 5–15)
BUN: 12 mg/dL (ref 8–23)
CO2: 29 mmol/L (ref 22–32)
Calcium: 8.8 mg/dL — ABNORMAL LOW (ref 8.9–10.3)
Chloride: 99 mmol/L (ref 98–111)
Creatinine, Ser: 0.73 mg/dL (ref 0.61–1.24)
GFR, Estimated: >60 mL/min (ref >60)
Glucose, Bld: 119 mg/dL — ABNORMAL HIGH (ref 70–99)
Potassium: 4.1 mmol/L (ref 3.5–5.1)
Sodium: 135 mmol/L (ref 135–145)

## 2022-12-26 LAB — CBC
HCT: 33.7 % — ABNORMAL LOW (ref 39.0–52.0)
Hemoglobin: 10.9 g/dL — ABNORMAL LOW (ref 13.0–17.0)
MCH: 31 pg (ref 26.0–34.0)
MCHC: 32.3 g/dL (ref 30.0–36.0)
MCV: 95.7 fL (ref 80.0–100.0)
Platelets: 205 10*3/uL (ref 150–400)
RBC: 3.52 MIL/uL — ABNORMAL LOW (ref 4.22–5.81)
RDW: 13.5 % (ref 11.5–15.5)
WBC: 9 10*3/uL (ref 4.0–10.5)
nRBC: 0 % (ref 0.0–0.2)

## 2022-12-26 NOTE — Progress Notes (Signed)
   Subjective: 1 Day Post-Op Procedure(s) (LRB): TOTAL KNEE ARTHROPLASTY (Left) Patient reports pain as moderate.   Patient seen in rounds by Dr. Lequita Halt. Patient having issues with pain control, it was known that this would potentially be an issue prior to surgery (chronic opioid use due to hx back issues). Denies chest pain, calf pain, SHOB.  We will continue therapy today.  Objective: Vital signs in last 24 hours: Temp:  [97.5 F (36.4 C)-98.7 F (37.1 C)] 98 F (36.7 C) (10/22 0704) Pulse Rate:  [58-76] 72 (10/22 0704) Resp:  [9-16] 16 (10/22 0704) BP: (116-161)/(69-95) 127/87 (10/22 0704) SpO2:  [98 %-100 %] 100 % (10/22 0704)  Intake/Output from previous day:  Intake/Output Summary (Last 24 hours) at 12/26/2022 0845 Last data filed at 12/26/2022 0508 Gross per 24 hour  Intake 1764.3 ml  Output 1920 ml  Net -155.7 ml     Intake/Output this shift: No intake/output data recorded.  Labs: Recent Labs    12/26/22 0322  HGB 10.9*   Recent Labs    12/26/22 0322  WBC 9.0  RBC 3.52*  HCT 33.7*  PLT 205   Recent Labs    12/26/22 0322  NA 135  K 4.1  CL 99  CO2 29  BUN 12  CREATININE 0.73  GLUCOSE 119*  CALCIUM 8.8*   No results for input(s): "LABPT", "INR" in the last 72 hours.  Exam: General - Patient is Alert and Oriented Extremity - Neurologically intact Neurovascular intact Sensation intact distally Dorsiflexion/Plantar flexion intact Dressing - dressing C/D/I Motor Function - intact, moving foot and toes well on exam.   Past Medical History:  Diagnosis Date   ADD (attention deficit disorder)    Complication of anesthesia    woke up on table x 3   DDD (degenerative disc disease), cervical 04/06/2002   Diverticulosis    Fibromyalgia    Hemorrhoids    Pancreatic alpha-amylase deficiency (HCC)    Pneumonia     Assessment/Plan: 1 Day Post-Op Procedure(s) (LRB): TOTAL KNEE ARTHROPLASTY (Left) Principal Problem:   Osteoarthritis of left  knee  Estimated body mass index is 23.38 kg/m as calculated from the following:   Height as of this encounter: 5\' 9"  (1.753 m).   Weight as of this encounter: 71.8 kg. Advance diet Up with therapy D/C IV fluids   Patient's anticipated LOS is less than 2 midnights, meeting these requirements: - Lives within 1 hour of care - Has a competent adult at home to recover with post-op recover - NO history of  - Diabetes  - Coronary Artery Disease  - Heart failure  - Heart attack  - Stroke  - DVT/VTE  - Cardiac arrhythmia  - Respiratory Failure/COPD  - Renal failure  - Anemia  - Advanced Liver disease  DVT Prophylaxis - Aspirin Weight bearing as tolerated. Continue therapy.  Plan is to go Home after hospital stay.  Still requiring IV dilaudid for pain control, needs to transition to PO meds. Will plan to stay until tomorrow to allow for adequate pain control prior to discharge.   Arther Abbott, PA-C Orthopedic Surgery [phone] 12/26/2022, 8:45 AM

## 2022-12-26 NOTE — TOC Transition Note (Signed)
Transition of Care [facility]) - CM/SW Discharge Note   Patient Details  Name: Nicholas [name] MRN: [id number] Date of Birth: [birth year]  Transition of Care [facility]) CM/SW Contact:  Amada Jupiter, LCSW Phone Number: 12/26/2022, 9:33 AM   Clinical Narrative:     Met with pt and confirming  he has received RW to room from [facility].  OPPT already set up with [facility].  No further TOC needs.  Final next level of care: OP Rehab Barriers to Discharge: No Barriers Identified   Patient Goals and CMS Choice      Discharge Placement                         Discharge Plan and Services Additional resources added to the After Visit Summary for                  DME Arranged: Walker rolling DME Agency: [facility]                  Social Determinants of Health (SDOH) Interventions SDOH Screenings   Food Insecurity: No Food Insecurity (12/25/2022)  Housing: Low Risk  (12/25/2022)  Transportation Needs: No Transportation Needs (12/25/2022)  Utilities: Not At Risk (12/25/2022)  Depression (PHQ2-9): High Risk (09/13/2021)  Social Connections: Unknown (07/17/2021)   Received from [facility]  Tobacco Use: Low Risk  (12/25/2022)     Readmission Risk Interventions     No data to display

## 2022-12-26 NOTE — Progress Notes (Signed)
Physical Therapy Treatment Patient Details Name: Nicholas [name] MRN: [id number] DOB: 1955-11-07 Today's Date: 12/26/2022   History of Present Illness 67 y.o. male admitted 12/25/22 for  L TKA. PMH: lumbar spondylosis, diverticulosis, fibromyalgia    PT Comments  Pt motivated but limited by pain.  He was premedicated prior to session.  He was able to ambulate 65' with RW and CGA but with limited weight shift to L LE. Performed limited exercises for pain control- focused on mobility this session.  Will continue to progress as able.  Noted in ortho note from this morning likely not discharging today due to pain control.     If plan is discharge home, recommend the following: A little help with bathing/dressing/bathroom;Assistance with cooking/housework;Help with stairs or ramp for entrance;Assist for transportation   Can travel by private vehicle        Equipment Recommendations  Rolling walker (2 wheels)    Recommendations for Other Services       Precautions / Restrictions Precautions Precautions: Fall;Knee Restrictions Weight Bearing Restrictions: Yes LLE Weight Bearing: Weight bearing as tolerated     Mobility  Bed Mobility Overal bed mobility: Needs Assistance Bed Mobility: Supine to Sit     Supine to sit: Min assist     General bed mobility comments: LIght min A for L LE for pain control    Transfers Overall transfer level: Needs assistance Equipment used: Rolling walker (2 wheels) Transfers: Sit to/from Stand Sit to Stand: Contact guard assist           General transfer comment: Good carryover for hand placement    Ambulation/Gait Ambulation/Gait assistance: Contact guard assist Gait Distance (Feet): 80 Feet Assistive device: Rolling walker (2 wheels) Gait Pattern/deviations: Step-to pattern, Decreased weight shift to left, Antalgic Gait velocity: decr     General Gait Details: Cues for RW position and pushing; Very limited weight shift to left -  cued for trying to get heel flat and sequencing with leg even if decreased weight shift   Stairs             Wheelchair Mobility     Tilt Bed    Modified Rankin (Stroke Patients Only)       Balance Overall balance assessment: Needs assistance Sitting-balance support: No upper extremity supported Sitting balance-Leahy Scale: Good     Standing balance support: Bilateral upper extremity supported, Reliant on assistive device for balance, No upper extremity supported Standing balance-Leahy Scale: Fair Standing balance comment: RW to ambulate but could static stand without UE support (weight shifted to R)                            Cognition Arousal: Alert Behavior During Therapy: WFL for tasks assessed/performed Overall Cognitive Status: Within Functional Limits for tasks assessed                                          Exercises Total Joint Exercises Ankle Circles/Pumps: AROM, 10 reps, Both, Supine Other Exercises Other Exercises: Pt with increased pain.  Limited exercises.  Performed gentle knee flex/ext at EOB with towel under foot to promote blood flow and decrease stiffness prior to ambulation.  10 reps.    General Comments        Pertinent Vitals/Pain Pain Assessment Pain Assessment: 0-10 Pain Score: 8  Pain Location: L knee Pain  Descriptors / Indicators: Sharp, Cramping Pain Intervention(s): Limited activity within patient's tolerance, Monitored during session, Premedicated before session, Repositioned, Ice applied    Home Living                          Prior Function            PT Goals (current goals can now be found in the care plan section) Progress towards PT goals: Progressing toward goals    Frequency    7X/week      PT Plan      Co-evaluation              AM-PAC PT "6 Clicks" Mobility   Outcome Measure  Help needed turning from your back to your side while in a flat bed without  using bedrails?: A Little Help needed moving from lying on your back to sitting on the side of a flat bed without using bedrails?: A Little Help needed moving to and from a bed to a chair (including a wheelchair)?: A Little Help needed standing up from a chair using your arms (e.g., wheelchair or bedside chair)?: A Little Help needed to walk in hospital room?: A Little Help needed climbing 3-5 steps with a railing? : A Lot 6 Click Score: 17    End of Session Equipment Utilized During Treatment: Gait belt Activity Tolerance: Patient limited by pain Patient left: in bed;with call bell/phone within reach Nurse Communication: Mobility status PT Visit Diagnosis: Difficulty in walking, not elsewhere classified (R26.2);Pain Pain - Right/Left: Left Pain - part of body: Knee     Time: 1143-1204 PT Time Calculation (min) (ACUTE ONLY): 21 min  Charges:    $Gait Training: 8-22 mins PT General Charges $$ ACUTE PT VISIT: 1 Visit                     Anise Salvo, PT Acute Rehab Services [facility] [phone]    Rayetta Humphrey 12/26/2022, 12:56 PM

## 2022-12-26 NOTE — Care Management Obs Status (Signed)
MEDICARE OBSERVATION STATUS NOTIFICATION   Patient Details  Name: Nicholas [name] MRN: [id number] Date of Birth: 08/13/1955   Medicare Observation Status Notification Given:  Hart Robinsons, LCSW 12/26/2022, 11:36 AM

## 2022-12-26 NOTE — Progress Notes (Signed)
Physical Therapy Treatment Patient Details Name: Nicholas [name] MRN: [id number] DOB: [birth year] Today's Date: 12/26/2022   History of Present Illness 67 y.o. male admitted 12/25/22 for  L TKA. PMH: lumbar spondylosis, diverticulosis, fibromyalgia    PT Comments  Similar to am session.  Still limited by pain with session focusing on mobility and light exercises.  Pt with little weight shift to L LE but compensates well with bil UE (heavy use of RW).  Will conitnue to progress as able.     If plan is discharge home, recommend the following: A little help with bathing/dressing/bathroom;Assistance with cooking/housework;Help with stairs or ramp for entrance;Assist for transportation   Can travel by private vehicle        Equipment Recommendations  Rolling walker (2 wheels)    Recommendations for Other Services       Precautions / Restrictions Precautions Precautions: Fall;Knee Restrictions Weight Bearing Restrictions: Yes LLE Weight Bearing: Weight bearing as tolerated     Mobility  Bed Mobility Overal bed mobility: Needs Assistance Bed Mobility: Supine to Sit, Sit to Supine     Supine to sit: Min assist Sit to supine: Min assist   General bed mobility comments: LIght min A for L LE for pain control    Transfers Overall transfer level: Needs assistance Equipment used: Rolling walker (2 wheels) Transfers: Sit to/from Stand Sit to Stand: Contact guard assist           General transfer comment: Good carryover for hand placement    Ambulation/Gait Ambulation/Gait assistance: Contact guard assist Gait Distance (Feet): 80 Feet Assistive device: Rolling walker (2 wheels) Gait Pattern/deviations: Step-to pattern, Decreased weight shift to left, Antalgic Gait velocity: decr     General Gait Details: Good Carryover for RW proximity; Very limited weight shift to left - cued for trying to get heel flat and sequencing with leg even if decreased weight  shift   Stairs             Wheelchair Mobility     Tilt Bed    Modified Rankin (Stroke Patients Only)       Balance Overall balance assessment: Needs assistance Sitting-balance support: No upper extremity supported Sitting balance-Leahy Scale: Good     Standing balance support: Bilateral upper extremity supported, Reliant on assistive device for balance, No upper extremity supported Standing balance-Leahy Scale: Fair Standing balance comment: RW to ambulate but could static stand without UE support (weight shifted to R)                            Cognition Arousal: Alert Behavior During Therapy: WFL for tasks assessed/performed Overall Cognitive Status: Within Functional Limits for tasks assessed                                          Exercises Total Joint Exercises Ankle Circles/Pumps: AROM, 10 reps, Both, Supine Other Exercises Other Exercises: Pt with increased pain.  Limited exercises.  Performed gentle knee flex/ext at EOB with towel under foot to promote blood flow and decrease stiffness prior to ambulation.  10 reps.    General Comments        Pertinent Vitals/Pain Pain Assessment Pain Assessment: 0-10 Pain Score: 9  Pain Location: L knee Pain Descriptors / Indicators: Sharp, Cramping Pain Intervention(s): Limited activity within patient's tolerance, Monitored during session, Premedicated before session,  Repositioned, Ice applied    Home Living                          Prior Function            PT Goals (current goals can now be found in the care plan section) Progress towards PT goals: Progressing toward goals    Frequency    7X/week      PT Plan      Co-evaluation              AM-PAC PT "6 Clicks" Mobility   Outcome Measure  Help needed turning from your back to your side while in a flat bed without using bedrails?: A Little Help needed moving from lying on your back to sitting on  the side of a flat bed without using bedrails?: A Little Help needed moving to and from a bed to a chair (including a wheelchair)?: A Little Help needed standing up from a chair using your arms (e.g., wheelchair or bedside chair)?: A Little Help needed to walk in hospital room?: A Little Help needed climbing 3-5 steps with a railing? : A Little 6 Click Score: 18    End of Session Equipment Utilized During Treatment: Gait belt Activity Tolerance: Patient limited by pain Patient left: in bed;with call bell/phone within reach;with SCD's reapplied (ice pack and gel packs in place; declined CPM - did report some relief in CPM yesterday, instructed pt to ask for it when ready) Nurse Communication: Mobility status PT Visit Diagnosis: Difficulty in walking, not elsewhere classified (R26.2);Pain Pain - Right/Left: Left Pain - part of body: Knee     Time: 1434-1455 PT Time Calculation (min) (ACUTE ONLY): 21 min  Charges:    $Gait Training: 8-22 mins PT General Charges $$ ACUTE PT VISIT: 1 Visit                     Anise Salvo, PT Acute Rehab Services [facility] [phone]    Rayetta Humphrey 12/26/2022, 3:07 PM

## 2022-12-27 DIAGNOSIS — F988 Other specified behavioral and emotional disorders with onset usually occurring in childhood and adolescence: Secondary | ICD-10-CM | POA: Diagnosis not present

## 2022-12-27 DIAGNOSIS — Z96652 Presence of left artificial knee joint: Secondary | ICD-10-CM | POA: Diagnosis not present

## 2022-12-27 DIAGNOSIS — G8929 Other chronic pain: Secondary | ICD-10-CM | POA: Diagnosis not present

## 2022-12-27 DIAGNOSIS — M797 Fibromyalgia: Secondary | ICD-10-CM | POA: Diagnosis not present

## 2022-12-27 DIAGNOSIS — M1712 Unilateral primary osteoarthritis, left knee: Secondary | ICD-10-CM | POA: Diagnosis not present

## 2022-12-27 DIAGNOSIS — Z79891 Long term (current) use of opiate analgesic: Secondary | ICD-10-CM | POA: Diagnosis not present

## 2022-12-27 DIAGNOSIS — M5030 Other cervical disc degeneration, unspecified cervical region: Secondary | ICD-10-CM | POA: Diagnosis not present

## 2022-12-27 DIAGNOSIS — Z8261 Family history of arthritis: Secondary | ICD-10-CM | POA: Diagnosis not present

## 2022-12-27 DIAGNOSIS — M25461 Effusion, right knee: Secondary | ICD-10-CM | POA: Diagnosis not present

## 2022-12-27 DIAGNOSIS — E739 Lactose intolerance, unspecified: Secondary | ICD-10-CM | POA: Diagnosis not present

## 2022-12-27 DIAGNOSIS — M47816 Spondylosis without myelopathy or radiculopathy, lumbar region: Secondary | ICD-10-CM | POA: Diagnosis not present

## 2022-12-27 DIAGNOSIS — Z79899 Other long term (current) drug therapy: Secondary | ICD-10-CM | POA: Diagnosis not present

## 2022-12-27 DIAGNOSIS — Z91040 Latex allergy status: Secondary | ICD-10-CM | POA: Diagnosis not present

## 2022-12-27 DIAGNOSIS — Z8616 Personal history of COVID-19: Secondary | ICD-10-CM | POA: Diagnosis not present

## 2022-12-27 DIAGNOSIS — Z880 Allergy status to penicillin: Secondary | ICD-10-CM | POA: Diagnosis not present

## 2022-12-27 LAB — CBC
HCT: 32.1 % — ABNORMAL LOW (ref 39.0–52.0)
Hemoglobin: 10.4 g/dL — ABNORMAL LOW (ref 13.0–17.0)
MCH: 30.6 pg (ref 26.0–34.0)
MCHC: 32.4 g/dL (ref 30.0–36.0)
MCV: 94.4 fL (ref 80.0–100.0)
Platelets: 185 10*3/uL (ref 150–400)
RBC: 3.4 MIL/uL — ABNORMAL LOW (ref 4.22–5.81)
RDW: 13.2 % (ref 11.5–15.5)
WBC: 9.3 10*3/uL (ref 4.0–10.5)
nRBC: 0 % (ref 0.0–0.2)

## 2022-12-27 MED ORDER — HYDROMORPHONE HCL 2 MG PO TABS
4.0000 mg | ORAL_TABLET | ORAL | Status: DC | PRN
  Administered 2022-12-27 – 2022-12-29 (×13): 6 mg via ORAL
  Filled 2022-12-27 (×13): qty 3

## 2022-12-27 NOTE — Progress Notes (Signed)
Physical Therapy Treatment Patient Details Name: Nicholas [name] MRN: [id number] DOB: 1955-04-17 Today's Date: 12/27/2022   History of Present Illness 67 y.o. male admitted 12/25/22 for  L TKA. PMH: lumbar spondylosis, diverticulosis, fibromyalgia    PT Comments  Pt continues to be limited by pain.  He is ambulating with steady balance but limited weight shift to L side.  Pt with limited knee ROM and not tolerating exercises this session.  Pt with good upper body strength -will likely be able to perform stairs using UE to compensate but declined attempt today due to pain.  Continnue to progress as able.     If plan is discharge home, recommend the following: A little help with bathing/dressing/bathroom;Assistance with cooking/housework;Help with stairs or ramp for entrance;Assist for transportation;A little help with walking and/or transfers   Can travel by private vehicle        Equipment Recommendations  Rolling walker (2 wheels)    Recommendations for Other Services       Precautions / Restrictions Precautions Precautions: Fall;Knee Restrictions LLE Weight Bearing: Weight bearing as tolerated     Mobility  Bed Mobility Overal bed mobility: Needs Assistance Bed Mobility: Supine to Sit, Sit to Supine     Supine to sit: Min assist Sit to supine: Min assist   General bed mobility comments: LIght min A for L LE for pain control    Transfers Overall transfer level: Needs assistance Equipment used: Rolling walker (2 wheels) Transfers: Sit to/from Stand Sit to Stand: Contact guard assist           General transfer comment: Good carryover for hand placement    Ambulation/Gait Ambulation/Gait assistance: Contact guard assist Gait Distance (Feet): 80 Feet Assistive device: Rolling walker (2 wheels) Gait Pattern/deviations: Step-to pattern, Decreased weight shift to left, Antalgic Gait velocity: decr     General Gait Details: Good Carryover for RW proximity; Very  limited weight shift to left - cued for trying to get heel flat and sequencing with leg even if decreased weight shift   Stairs             Wheelchair Mobility     Tilt Bed    Modified Rankin (Stroke Patients Only)       Balance Overall balance assessment: Needs assistance Sitting-balance support: No upper extremity supported Sitting balance-Leahy Scale: Good     Standing balance support: Bilateral upper extremity supported, Reliant on assistive device for balance, No upper extremity supported Standing balance-Leahy Scale: Fair Standing balance comment: RW to ambulate but could static stand without UE support (weight shifted to R)                            Cognition Arousal: Alert Behavior During Therapy: WFL for tasks assessed/performed Overall Cognitive Status: Within Functional Limits for tasks assessed                                          Exercises Other Exercises Other Exercises: Not able to tolerate; reports he is doing his ankle pumps and quad sets    General Comments        Pertinent Vitals/Pain Pain Assessment Pain Assessment: 0-10 Pain Score: 9  Pain Location: L knee Pain Descriptors / Indicators: Sharp, Cramping Pain Intervention(s): Limited activity within patient's tolerance, Monitored during session, Premedicated before session, Repositioned  Home Living                          Prior Function            PT Goals (current goals can now be found in the care plan section) Progress towards PT goals: Not progressing toward goals - comment (Limited by pain)    Frequency    7X/week      PT Plan      Co-evaluation              AM-PAC PT "6 Clicks" Mobility   Outcome Measure  Help needed turning from your back to your side while in a flat bed without using bedrails?: A Little Help needed moving from lying on your back to sitting on the side of a flat bed without using bedrails?: A  Little Help needed moving to and from a bed to a chair (including a wheelchair)?: A Little Help needed standing up from a chair using your arms (e.g., wheelchair or bedside chair)?: A Little Help needed to walk in hospital room?: A Little Help needed climbing 3-5 steps with a railing? : A Little 6 Click Score: 18    End of Session Equipment Utilized During Treatment: Gait belt Activity Tolerance: Patient limited by pain Patient left: in bed;with call bell/phone within reach;with SCD's reapplied Nurse Communication: Mobility status PT Visit Diagnosis: Difficulty in walking, not elsewhere classified (R26.2);Pain Pain - Right/Left: Left Pain - part of body: Knee     Time: 0865-7846 PT Time Calculation (min) (ACUTE ONLY): 21 min  Charges:    $Gait Training: 8-22 mins PT General Charges $$ ACUTE PT VISIT: 1 Visit                     Anise Salvo, PT Acute Rehab Services [facility] [phone]    Nicholas [name] 12/27/2022, 12:31 PM

## 2022-12-27 NOTE — Progress Notes (Signed)
   Subjective: 2 Days Post-Op Procedure(s) (LRB): TOTAL KNEE ARTHROPLASTY (Left) Patient seen in rounds by Dr. Lequita Halt. Patient is well, and has had no acute complaints or problems Patient reports pain as  moderate to severe .  Only reports slight improvement since yesterday.  Objective: Vital signs in last 24 hours: Temp:  [98.2 F (36.8 C)-100.1 F (37.8 C)] 99.3 F (37.4 C) (10/23 0439) Pulse Rate:  [71-85] 85 (10/23 0439) Resp:  [16-18] 18 (10/23 0439) BP: (127-148)/(83-89) 135/83 (10/23 0439) SpO2:  [99 %-100 %] 100 % (10/23 0439)  Intake/Output from previous day:  Intake/Output Summary (Last 24 hours) at 12/27/2022 0815 Last data filed at 12/27/2022 0600 Gross per 24 hour  Intake 2272.8 ml  Output 2920 ml  Net -647.2 ml    Intake/Output this shift: No intake/output data recorded.  Labs: Recent Labs    12/26/22 0322 12/27/22 0312  HGB 10.9* 10.4*   Recent Labs    12/26/22 0322 12/27/22 0312  WBC 9.0 9.3  RBC 3.52* 3.40*  HCT 33.7* 32.1*  PLT 205 185   Recent Labs    12/26/22 0322  NA 135  K 4.1  CL 99  CO2 29  BUN 12  CREATININE 0.73  GLUCOSE 119*  CALCIUM 8.8*   No results for input(s): "LABPT", "INR" in the last 72 hours.  Exam: General - Patient is Alert and Oriented Extremity - Neurologically intact Neurovascular intact Sensation intact distally Dorsiflexion/Plantar flexion intact Dressing/Incision - clean, dry, no drainage Motor Function - intact, moving foot and toes well on exam.  Past Medical History:  Diagnosis Date   ADD (attention deficit disorder)    Complication of anesthesia    woke up on table x 3   DDD (degenerative disc disease), cervical 04/06/2002   Diverticulosis    Fibromyalgia    Hemorrhoids    Pancreatic alpha-amylase deficiency (HCC)    Pneumonia     Assessment/Plan: 2 Days Post-Op Procedure(s) (LRB): TOTAL KNEE ARTHROPLASTY (Left) Principal Problem:   Osteoarthritis of left knee  Estimated body mass  index is 23.38 kg/m as calculated from the following:   Height as of this encounter: 5\' 9"  (1.753 m).   Weight as of this encounter: 71.8 kg.  DVT Prophylaxis - Aspirin Weight-bearing as tolerated.  Continue physical therapy. Expected discharge home today pending progress and pain management. Scheduled for OPPT at [facility], PA-C Orthopedic Surgery 12/27/2022, 8:15 AM

## 2022-12-27 NOTE — Progress Notes (Signed)
Physical Therapy Treatment Patient Details Name: Nicholas [name] MRN: [id number] DOB: 11/27/1955 Today's Date: 12/27/2022   History of Present Illness 67 y.o. male admitted 12/25/22 for  L TKA. PMH: lumbar spondylosis, diverticulosis, fibromyalgia    PT Comments  Pt reports slight improvement in pain but still very painful and limited due to pain.  Pt with limited knee ROM due to pain, performed gentle ROM as able.  Pt ambulating 43' with RW and antalgic pattern, but held on stairs due to pain and pt not discharging today.  Will cont to progress as able.     If plan is discharge home, recommend the following: A little help with bathing/dressing/bathroom;Assistance with cooking/housework;Help with stairs or ramp for entrance;Assist for transportation;A little help with walking and/or transfers   Can travel by private vehicle        Equipment Recommendations  Rolling walker (2 wheels)    Recommendations for Other Services       Precautions / Restrictions Precautions Precautions: Fall;Knee Restrictions LLE Weight Bearing: Weight bearing as tolerated     Mobility  Bed Mobility Overal bed mobility: Needs Assistance Bed Mobility: Supine to Sit, Sit to Supine     Supine to sit: Min assist Sit to supine: Min assist   General bed mobility comments: LIght min A for L LE for pain control - educated on gait belt to assist, pt reports good improvement with this    Transfers Overall transfer level: Needs assistance Equipment used: Rolling walker (2 wheels) Transfers: Sit to/from Stand Sit to Stand: Contact guard assist, From elevated surface           General transfer comment: increased time due to pain but no assist    Ambulation/Gait Ambulation/Gait assistance: Supervision Gait Distance (Feet): 80 Feet Assistive device: Rolling walker (2 wheels) Gait Pattern/deviations: Step-to pattern, Decreased weight shift to left, Antalgic Gait velocity: decr     General Gait  Details: Good Carryover for RW proximity; Very limited weight shift to left - cued for trying to get heel flat and sequencing with leg even if decreased weight shift; compensates for pain with heavy RW use/good upper body strength   Stairs Stairs:  (held due to pain)           Wheelchair Mobility     Tilt Bed    Modified Rankin (Stroke Patients Only)       Balance Overall balance assessment: Needs assistance Sitting-balance support: No upper extremity supported Sitting balance-Leahy Scale: Good     Standing balance support: Bilateral upper extremity supported, Reliant on assistive device for balance, No upper extremity supported Standing balance-Leahy Scale: Fair Standing balance comment: RW to ambulate but could static stand without UE support (weight shifted to R)                            Cognition Arousal: Alert Behavior During Therapy: WFL for tasks assessed/performed Overall Cognitive Status: Within Functional Limits for tasks assessed                                          Exercises Total Joint Exercises Ankle Circles/Pumps: AROM, 10 reps, Both, Supine Quad Sets: AROM, Left, 10 reps, Supine (small lift under L heel , not able to tolerate towel roll) Goniometric ROM: L knee ~10 to 30 Other Exercises Other Exercises: Pt with increased pain.  Limited exercises.  Performed gentle knee flex/ext at EOB with towel under foot to promote blood flow and decrease stiffness prior to ambulation.  10 reps. Other Exercises: Not able to tolerate; reports he is doing his ankle pumps and quad sets    General Comments        Pertinent Vitals/Pain Pain Assessment Pain Assessment: 0-10 Pain Score: 8  Pain Location: L knee Pain Descriptors / Indicators: Sharp, Cramping Pain Intervention(s): Limited activity within patient's tolerance, Monitored during session, Premedicated before session, Repositioned, Ice applied (reports slight improvment)     Home Living                          Prior Function            PT Goals (current goals can now be found in the care plan section) Progress towards PT goals: Progressing toward goals    Frequency    7X/week      PT Plan      Co-evaluation              AM-PAC PT "6 Clicks" Mobility   Outcome Measure  Help needed turning from your back to your side while in a flat bed without using bedrails?: A Little Help needed moving from lying on your back to sitting on the side of a flat bed without using bedrails?: A Little Help needed moving to and from a bed to a chair (including a wheelchair)?: A Little Help needed standing up from a chair using your arms (e.g., wheelchair or bedside chair)?: A Little Help needed to walk in hospital room?: A Little Help needed climbing 3-5 steps with a railing? : A Little 6 Click Score: 18    End of Session Equipment Utilized During Treatment: Gait belt Activity Tolerance: Patient limited by pain Patient left: in bed;with call bell/phone within reach;with SCD's reapplied;with bed alarm set Nurse Communication: Mobility status PT Visit Diagnosis: Difficulty in walking, not elsewhere classified (R26.2);Pain Pain - Right/Left: Left Pain - part of body: Knee     Time: 1610-9604 PT Time Calculation (min) (ACUTE ONLY): 20 min  Charges:    $Gait Training: 8-22 mins PT General Charges $$ ACUTE PT VISIT: 1 Visit                     Anise Salvo, PT Acute Rehab Services [facility] [phone]    Nicholas [name] 12/27/2022, 5:35 PM

## 2022-12-28 LAB — URIC ACID: Uric Acid, Serum: 4.5 mg/dL (ref 3.7–8.6)

## 2022-12-28 MED ORDER — METHYLPREDNISOLONE 4 MG PO TBPK
8.0000 mg | ORAL_TABLET | Freq: Every evening | ORAL | Status: AC
  Administered 2022-12-28: 8 mg via ORAL

## 2022-12-28 MED ORDER — LIDOCAINE HCL 1 % IJ SOLN
10.0000 mL | Freq: Once | INTRAMUSCULAR | Status: DC
  Filled 2022-12-28: qty 10

## 2022-12-28 MED ORDER — METHYLPREDNISOLONE 4 MG PO TBPK: 8.0000 mg | ORAL_TABLET | Freq: Every evening | ORAL | Status: DC

## 2022-12-28 MED ORDER — METHYLPREDNISOLONE 4 MG PO TBPK
4.0000 mg | ORAL_TABLET | ORAL | Status: AC
  Administered 2022-12-28: 4 mg via ORAL

## 2022-12-28 MED ORDER — METHYLPREDNISOLONE 4 MG PO TBPK
8.0000 mg | ORAL_TABLET | Freq: Every morning | ORAL | Status: AC
  Administered 2022-12-28: 8 mg via ORAL
  Filled 2022-12-28: qty 21

## 2022-12-28 MED ORDER — METHYLPREDNISOLONE 4 MG PO TBPK: 4.0000 mg | ORAL_TABLET | Freq: Four times a day (QID) | ORAL | Status: DC

## 2022-12-28 MED ORDER — METHYLPREDNISOLONE 4 MG PO TBPK
4.0000 mg | ORAL_TABLET | Freq: Three times a day (TID) | ORAL | Status: DC
  Administered 2022-12-29 (×2): 4 mg via ORAL

## 2022-12-28 NOTE — Progress Notes (Signed)
   Subjective: 3 Days Post-Op Procedure(s) (LRB): TOTAL KNEE ARTHROPLASTY (Left) Patient reports pain as mild.   Patient seen in rounds for Dr. Lequita Halt. Patient is limited today due to nonoperative knee, which has significant swelling. No issues overnight, denies chest pain or SOB. Plan is to go Home after hospital stay.  Objective: Vital signs in last 24 hours: Temp:  [98.2 F (36.8 C)-100 F (37.8 C)] 98.2 F (36.8 C) (10/24 0626) Pulse Rate:  [79-81] 79 (10/24 0626) Resp:  [16-18] 16 (10/24 0626) BP: (114-138)/(53-82) 135/72 (10/24 0626) SpO2:  [96 %-98 %] 98 % (10/24 0626)  Intake/Output from previous day:  Intake/Output Summary (Last 24 hours) at 12/28/2022 1228 Last data filed at 12/28/2022 0930 Gross per 24 hour  Intake 1200 ml  Output 1000 ml  Net 200 ml    Intake/Output this shift: Total I/O In: 240 [P.O.:240] Out: 1000 [Urine:1000]  Labs: Recent Labs    12/26/22 0322 12/27/22 0312  HGB 10.9* 10.4*   Recent Labs    12/26/22 0322 12/27/22 0312  WBC 9.0 9.3  RBC 3.52* 3.40*  HCT 33.7* 32.1*  PLT 205 185   Recent Labs    12/26/22 0322  NA 135  K 4.1  CL 99  CO2 29  BUN 12  CREATININE 0.73  GLUCOSE 119*  CALCIUM 8.8*   No results for input(s): "LABPT", "INR" in the last 72 hours.  Exam: General - Patient is Alert and Oriented Extremity - Neurologically intact Neurovascular intact Sensation intact distally Dorsiflexion/Plantar flexion intact Right knee shows a large effusion Dressing/Incision - clean, dry, no drainage Motor Function - intact, moving foot and toes well on exam.   Past Medical History:  Diagnosis Date   ADD (attention deficit disorder)    Complication of anesthesia    woke up on table x 3   DDD (degenerative disc disease), cervical 04/06/2002   Diverticulosis    Fibromyalgia    Hemorrhoids    Pancreatic alpha-amylase deficiency (HCC)    Pneumonia     Assessment/Plan: 3 Days Post-Op Procedure(s) (LRB): TOTAL  KNEE ARTHROPLASTY (Left) Principal Problem:   Osteoarthritis of left knee  Estimated body mass index is 23.38 kg/m as calculated from the following:   Height as of this encounter: 5\' 9"  (1.753 m).   Weight as of this encounter: 71.8 kg. Up with therapy  DVT Prophylaxis - Aspirin Weight-bearing as tolerated  Right knee aspiration performed at bedside without difficulty. See separate procedure note.  Discharge to home with OPPT once cleared with PT.  Arther Abbott, PA-C Orthopedic Surgery [phone] 12/28/2022, 12:28 PM

## 2022-12-28 NOTE — Progress Notes (Signed)
PT Cancellation Note  Patient Details Name: Nicholas [name] MRN: [id number] DOB: [birth year]   Cancelled Treatment:    Reason Eval/Treat Not Completed: Medical issues which prohibited therapy (per RN, pt's R knee (non operative LE) became quite swollen overnight, plan is for Dr. Lequita Halt to aspirate R knee later today. Pt stated he's not able to tolerate activity at present. Will follow.)  Tamala Ser PT 12/28/2022  Acute Rehabilitation Services  Office [phone]

## 2022-12-28 NOTE — Procedures (Signed)
After verbal consent was obtained, the right knee was identified and prepared with Chloraprep.  The knee was then injected from a superolateral approach with a mixture of 3cc of Lidocaine. Adequate time was allowed for the medication to take effect. An 18 gauge needle was then introduced from the superolateral aspect and then 95 cc's of cloudy yellow appearing fluid was aspirated. The patient tolerated the procedure well, without complications. The injection site was dressed with an adhesive bandage. The patient was advised to alert the nurse with any adverse reactions. The patient was advised to ice the knee intermittently over the next 24 hours.  Arther Abbott, PA-C Orthopedic Surgery [facility] [place]

## 2022-12-28 NOTE — Progress Notes (Signed)
Physical Therapy Treatment Patient Details Name: Nicholas [name] MRN: [id number] DOB: 1956/01/07 Today's Date: 12/28/2022   History of Present Illness 67 y.o. male admitted 12/25/22 for  L TKA. s/p aspiration of R knee 12/28/22. PMH: lumbar spondylosis, diverticulosis, fibromyalgia    PT Comments  Pt is s/p aspiration of R knee earlier today. He reports severe pain in R knee with attempted weight bearing and noted "my right knee is blowing up again", nurse notified. Pt tolerated L TKA exercises sitting at edge of bed and in supine. Pt was not able to tolerate weight bearing on R knee so did not attempt sit to stand. Ice applied to B knees at end of session.     If plan is discharge home, recommend the following: A little help with bathing/dressing/bathroom;Assistance with cooking/housework;Help with stairs or ramp for entrance;Assist for transportation;A little help with walking and/or transfers   Can travel by private vehicle        Equipment Recommendations  Rolling walker (2 wheels)    Recommendations for Other Services       Precautions / Restrictions Precautions Precautions: Fall;Knee Precaution Booklet Issued: Yes (comment) Precaution Comments: reviewed no pillow under knee Restrictions Weight Bearing Restrictions: No LLE Weight Bearing: Weight bearing as tolerated     Mobility  Bed Mobility   Bed Mobility: Supine to Sit, Sit to Supine     Supine to sit: Modified independent (Device/Increase time), HOB elevated, Used rails Sit to supine: Modified independent (Device/Increase time), HOB elevated, Used rails   General bed mobility comments: used gait belt as LLE lifter, pt unable to tolerate weightbearing on RLE so did not attempt to stand    Transfers                        Ambulation/Gait                   Stairs             Wheelchair Mobility     Tilt Bed    Modified Rankin (Stroke Patients Only)       Balance Overall  balance assessment: Needs assistance Sitting-balance support: No upper extremity supported Sitting balance-Leahy Scale: Good                                      Cognition Arousal: Alert Behavior During Therapy: WFL for tasks assessed/performed Overall Cognitive Status: Within Functional Limits for tasks assessed                                          Exercises Total Joint Exercises Short Arc Quad: AAROM, Left, 5 reps, Supine Heel Slides: AAROM, Left, 5 reps, Supine Long Arc Quad: AAROM, Left, 5 reps, Seated Knee Flexion: AAROM, Left, 5 reps, Seated Goniometric ROM: 10-45* AAROM L knee    General Comments        Pertinent Vitals/Pain Pain Assessment Pain Score: 9  Pain Location: B knees Pain Descriptors / Indicators: Sharp Pain Intervention(s): Limited activity within patient's tolerance, Monitored during session, Premedicated before session, Repositioned, Ice applied, Patient requesting pain meds-RN notified    Home Living  Prior Function            PT Goals (current goals can now be found in the care plan section) Acute Rehab PT Goals Patient Stated Goal: 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 PT Goal Formulation: With patient Time For Goal Achievement: 01/01/23 Potential to Achieve Goals: Good Progress towards PT goals: Not progressing toward goals - comment (pain in R knee)    Frequency    7X/week      PT Plan      Co-evaluation              AM-PAC PT "6 Clicks" Mobility   Outcome Measure  Help needed turning from your back to your side while in a flat bed without using bedrails?: A Little Help needed moving from lying on your back to sitting on the side of a flat bed without using bedrails?: A Little Help needed moving to and from a bed to a chair (including a wheelchair)?: A Little Help needed standing up from a chair using your arms (e.g.,  wheelchair or bedside chair)?: A Lot Help needed to walk in hospital room?: Total Help needed climbing 3-5 steps with a railing? : Total 6 Click Score: 13    End of Session Equipment Utilized During Treatment: Gait belt Activity Tolerance: Patient limited by pain Patient left: in bed;with call bell/phone within reach;with bed alarm set Nurse Communication: Mobility status PT Visit Diagnosis: Difficulty in walking, not elsewhere classified (R26.2);Pain Pain - Right/Left: Left Pain - part of body: Knee     Time: 1610-9604 PT Time Calculation (min) (ACUTE ONLY): 21 min  Charges:    $Therapeutic Exercise: 8-22 mins PT General Charges $$ ACUTE PT VISIT: 1 Visit                     Tamala Ser PT 12/28/2022  Acute Rehabilitation Services  Office [phone]

## 2022-12-28 NOTE — Plan of Care (Signed)

## 2022-12-29 ENCOUNTER — Other Ambulatory Visit: Payer: Self-pay

## 2022-12-29 ENCOUNTER — Other Ambulatory Visit (HOSPITAL_COMMUNITY): Payer: Self-pay

## 2022-12-29 MED ORDER — GABAPENTIN 300 MG PO CAPS
300.0000 mg | ORAL_CAPSULE | Freq: Three times a day (TID) | ORAL | 0 refills | Status: AC
  Filled 2022-12-29: qty 84, 42d supply, fill #0

## 2022-12-29 MED ORDER — HYDROMORPHONE HCL 2 MG PO TABS
2.0000 mg | ORAL_TABLET | Freq: Four times a day (QID) | ORAL | 0 refills | Status: AC | PRN
  Filled 2022-12-29: qty 42, 5d supply, fill #0

## 2022-12-29 MED ORDER — TRAMADOL HCL 50 MG PO TABS
50.0000 mg | ORAL_TABLET | Freq: Four times a day (QID) | ORAL | 0 refills | Status: DC | PRN
  Filled 2022-12-29: qty 40, 5d supply, fill #0

## 2022-12-29 MED ORDER — ONDANSETRON HCL 4 MG PO TABS
4.0000 mg | ORAL_TABLET | Freq: Four times a day (QID) | ORAL | 0 refills | Status: DC | PRN
  Filled 2022-12-29: qty 30, 8d supply, fill #0

## 2022-12-29 MED ORDER — ASPIRIN 81 MG PO CHEW
81.0000 mg | CHEWABLE_TABLET | Freq: Two times a day (BID) | ORAL | 0 refills | Status: AC
  Filled 2022-12-29: qty 34, 17d supply, fill #0

## 2022-12-29 MED ORDER — METHOCARBAMOL 500 MG PO TABS
500.0000 mg | ORAL_TABLET | Freq: Four times a day (QID) | ORAL | 0 refills | Status: DC | PRN
  Filled 2022-12-29: qty 40, 10d supply, fill #0

## 2022-12-29 NOTE — Progress Notes (Addendum)
Physical Therapy Treatment Patient Details Name: Nicholas [name] MRN: [id number] DOB: 1955-04-30 Today's Date: 12/29/2022   History of Present Illness 67 y.o. male admitted 12/25/22 for  L TKA. s/p aspiration of R knee 12/28/22. PMH: lumbar spondylosis, diverticulosis, fibromyalgia    PT Comments  Pt in L CPM at start of session. Pt reports swelling medial R knee, and swelling in L thigh. He stated he's not able to move LLE as well as he could 2 days ago, noted he's not able to fully extend L knee with ambulation and maintains L knee in flexed position. Pt ambulated 6' with RW, no loss of balance. He reports independence with TKA HEP. BLEs elevated with ice to B knees at end of session. Encouraged ankle pumps and quad sets to be done independently. Pt and spouse do not feel ready to DC home today.    If plan is discharge home, recommend the following: A little help with bathing/dressing/bathroom;Assistance with cooking/housework;Help with stairs or ramp for entrance;Assist for transportation;A little help with walking and/or transfers   Can travel by private vehicle        Equipment Recommendations  Rolling walker (2 wheels)    Recommendations for Other Services       Precautions / Restrictions Precautions Precautions: Fall;Knee Precaution Booklet Issued: Yes (comment) Precaution Comments: reviewed no pillow under knee Restrictions Weight Bearing Restrictions: No LLE Weight Bearing: Weight bearing as tolerated     Mobility  Bed Mobility Overal bed mobility: Needs Assistance Bed Mobility: Supine to Sit     Supine to sit: Min assist     General bed mobility comments: min A for LLE out of bed    Transfers Overall transfer level: Needs assistance Equipment used: Rolling walker (2 wheels) Transfers: Sit to/from Stand Sit to Stand: Supervision           General transfer comment: increased time due to pain but no assist    Ambulation/Gait Ambulation/Gait  assistance: Supervision Gait Distance (Feet): 90 Feet Assistive device: Rolling walker (2 wheels) Gait Pattern/deviations: Step-to pattern, Decreased weight shift to left, Antalgic, Knee flexed in stance - left Gait velocity: decr     General Gait Details: pt maintains L knee in flexed position during stance phase, he stated he's unable to fully extend L knee which is a change from 2 days ago; able to WB on RLE today   Stairs             Wheelchair Mobility     Tilt Bed    Modified Rankin (Stroke Patients Only)       Balance Overall balance assessment: Needs assistance Sitting-balance support: No upper extremity supported Sitting balance-Leahy Scale: Good     Standing balance support: Bilateral upper extremity supported, Reliant on assistive device for balance, No upper extremity supported Standing balance-Leahy Scale: Fair                              Cognition Arousal: Alert Behavior During Therapy: WFL for tasks assessed/performed Overall Cognitive Status: Within Functional Limits for tasks assessed                                          Exercises Total Joint Exercises Ankle Circles/Pumps: AROM, 10 reps, Both, Supine Quad Sets: AROM, Left, 10 reps, Supine, Both    General Comments  Pertinent Vitals/Pain Pain Assessment Pain Score: 8  Pain Location: B knees Pain Descriptors / Indicators: Sharp Pain Intervention(s): Limited activity within patient's tolerance, Monitored during session, Premedicated before session, Ice applied    Home Living                          Prior Function            PT Goals (current goals can now be found in the care plan section) Acute Rehab PT Goals Patient Stated Goal: 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 PT Goal Formulation: With patient Time For Goal Achievement: 01/01/23 Potential to Achieve Goals: Good Progress towards PT goals:  Progressing toward goals    Frequency    7X/week      PT Plan      Co-evaluation              AM-PAC PT "6 Clicks" Mobility   Outcome Measure  Help needed turning from your back to your side while in a flat bed without using bedrails?: A Little Help needed moving from lying on your back to sitting on the side of a flat bed without using bedrails?: A Little Help needed moving to and from a bed to a chair (including a wheelchair)?: A Little Help needed standing up from a chair using your arms (e.g., wheelchair or bedside chair)?: A Little Help needed to walk in hospital room?: None Help needed climbing 3-5 steps with a railing? : A Little 6 Click Score: 19    End of Session Equipment Utilized During Treatment: Gait belt Activity Tolerance: Patient limited by pain Patient left: in chair;with chair alarm set;with family/visitor present;with bed alarm set;with nursing/sitter in room Nurse Communication: Mobility status PT Visit Diagnosis: Difficulty in walking, not elsewhere classified (R26.2);Pain Pain - Right/Left: Left Pain - part of body: Knee     Time: 4098-1191 PT Time Calculation (min) (ACUTE ONLY): 38 min  Charges:    $Gait Training: 8-22 mins $Therapeutic Exercise: 8-22 mins $Therapeutic Activity: 8-22 mins PT General Charges $$ ACUTE PT VISIT: 1 Visit                     Tamala Ser PT 12/29/2022  Acute Rehabilitation Services  Office [phone]

## 2022-12-29 NOTE — Progress Notes (Signed)
Orthopedic Tech Progress Note Patient Details:  DEMONTE WRIGHT 01-08-1956 [id number] CPM will be removed at 1:45. Patient was given a hinged knee brace due to complaints of pain in right knee.  CPM Left Knee CPM Left Knee: On Left Knee Flexion (Degrees): 40 Left Knee Extension (Degrees): 10  Post Interventions Patient Tolerated: Well Instructions Provided: Adjustment of device, Care of device Ortho Devices Type of Ortho Device: Knee Sleeve Ortho Device/Splint Interventions: Application   Post Interventions Patient Tolerated: Well Instructions Provided: Adjustment of device, Care of device  Shima Compere E Sye Schroepfer 12/29/2022, 10:06 AM

## 2022-12-29 NOTE — Progress Notes (Signed)
Physical Therapy Treatment Patient Details Name: Nicholas [name] MRN: [id number] DOB: 1956/02/21 Today's Date: 12/29/2022   History of Present Illness 67 y.o. male admitted 12/25/22 for  L TKA. s/p aspiration of R knee 12/28/22. PMH: lumbar spondylosis, diverticulosis, fibromyalgia    PT Comments  Pt ambulated 110' with RW, pt reported severe R knee pain with ambulation, noted decreased knee extension in stance phase LLE. Edema noted L knee. Stair training completed.  Pt reports he's been performing TKA HEP independently.     If plan is discharge home, recommend the following: A little help with bathing/dressing/bathroom;Assistance with cooking/housework;Help with stairs or ramp for entrance;Assist for transportation;A little help with walking and/or transfers   Can travel by private vehicle        Equipment Recommendations  Rolling walker (2 wheels)    Recommendations for Other Services       Precautions / Restrictions Precautions Precautions: Fall;Knee Precaution Booklet Issued: Yes (comment) Precaution Comments: reviewed no pillow under knee Restrictions Weight Bearing Restrictions: No LLE Weight Bearing: Weight bearing as tolerated     Mobility  Bed Mobility Overal bed mobility: Needs Assistance Bed Mobility: Sit to Supine     Supine to sit: Min assist Sit to supine: Modified independent (Device/Increase time), HOB elevated, Used rails   General bed mobility comments: used gait belt as leg lifter    Transfers Overall transfer level: Modified independent Equipment used: Rolling walker (2 wheels) Transfers: Sit to/from Stand Sit to Stand: Modified independent (Device/Increase time)           General transfer comment: increased time due to pain but no assist, relied upon BUE support on armrests    Ambulation/Gait Ambulation/Gait assistance: Supervision Gait Distance (Feet): 110 Feet Assistive device: Rolling walker (2 wheels) Gait Pattern/deviations:  Step-to pattern, Decreased weight shift to left, Antalgic, Knee flexed in stance - left Gait velocity: decr     General Gait Details: pt maintains L knee in flexed position during stance phase, he stated he's unable to fully extend L knee which is a change from 2 days ago; able to WB on RLE today but reports intense pain R knee   Stairs Stairs: Yes Stairs assistance: Supervision Stair Management: Two rails, Forwards, Step to pattern Number of Stairs: 3 General stair comments: VCs sequencing   Wheelchair Mobility     Tilt Bed    Modified Rankin (Stroke Patients Only)       Balance Overall balance assessment: Needs assistance Sitting-balance support: No upper extremity supported Sitting balance-Leahy Scale: Good     Standing balance support: Bilateral upper extremity supported, Reliant on assistive device for balance, No upper extremity supported Standing balance-Leahy Scale: Fair                              Cognition Arousal: Alert Behavior During Therapy: WFL for tasks assessed/performed Overall Cognitive Status: Within Functional Limits for tasks assessed                                          Exercises Total Joint Exercises Ankle Circles/Pumps: AROM, 10 reps, Both, Supine Quad Sets: AROM, Left, 10 reps, Supine, Both Other Exercises Other Exercises: orthotech present at end of session to place pt in L CPM    General Comments        Pertinent Vitals/Pain Pain Assessment Pain  Score: 7  Pain Location: B knees (R more than L) Pain Descriptors / Indicators: Sharp Pain Intervention(s): Limited activity within patient's tolerance, Monitored during session, Premedicated before session, Ice applied    Home Living                          Prior Function            PT Goals (current goals can now be found in the care plan section) Acute Rehab PT Goals Patient Stated Goal: climb scaffolding for setting up  telecommunications for golf tournaments PT Goal Formulation: With patient Time For Goal Achievement: 01/01/23 Potential to Achieve Goals: Good Progress towards PT goals: Progressing toward goals    Frequency    7X/week      PT Plan      Co-evaluation              AM-PAC PT "6 Clicks" Mobility   Outcome Measure  Help needed turning from your back to your side while in a flat bed without using bedrails?: None Help needed moving from lying on your back to sitting on the side of a flat bed without using bedrails?: None Help needed moving to and from a bed to a chair (including a wheelchair)?: None Help needed standing up from a chair using your arms (e.g., wheelchair or bedside chair)?: None Help needed to walk in hospital room?: None Help needed climbing 3-5 steps with a railing? : A Little 6 Click Score: 23    End of Session Equipment Utilized During Treatment: Gait belt Activity Tolerance: Patient limited by pain Patient left: in bed;with call bell/phone within reach Nurse Communication: Mobility status PT Visit Diagnosis: Difficulty in walking, not elsewhere classified (R26.2);Pain Pain - Right/Left: Left Pain - part of body: Knee     Time: 8119-1478 PT Time Calculation (min) (ACUTE ONLY): 27 min  Charges:    $Gait Training: 8-22 mins $Therapeutic Exercise: 8-22 mins $Therapeutic Activity: 8-22 mins PT General Charges $$ ACUTE PT VISIT: 1 Visit                     Tamala Ser PT 12/29/2022  Acute Rehabilitation Services  Office [phone]

## 2022-12-29 NOTE — Progress Notes (Signed)
   Subjective: 4 Days Post-Op Procedure(s) (LRB): TOTAL KNEE ARTHROPLASTY (Left) Patient seen in rounds for Dr. Lequita Halt. Patient is well. Left knee is feeling okay. Denies calf pain. Right knee is still bothersome. Had his right knee aspirated yesterday but the swelling recurred. Reports it is not as bad as yesterday though. He does state that he has had treatment to the right knee in the past but it has been about 8 years. Patient reports pain as moderate.    Objective: Vital signs in last 24 hours: Temp:  [97.7 F (36.5 C)-99.9 F (37.7 C)] 97.7 F (36.5 C) (10/25 0457) Pulse Rate:  [77-93] 77 (10/25 0457) Resp:  [16] 16 (10/25 0457) BP: (117-126)/(70-85) 126/84 (10/25 0457) SpO2:  [99 %-100 %] 100 % (10/25 0457)  Intake/Output from previous day:  Intake/Output Summary (Last 24 hours) at 12/29/2022 0803 Last data filed at 12/29/2022 0600 Gross per 24 hour  Intake 1440 ml  Output 2520 ml  Net -1080 ml    Intake/Output this shift: No intake/output data recorded.  Labs: Recent Labs    12/27/22 0312  HGB 10.4*   Recent Labs    12/27/22 0312  WBC 9.3  RBC 3.40*  HCT 32.1*  PLT 185   No results for input(s): "NA", "K", "CL", "CO2", "BUN", "CREATININE", "GLUCOSE", "CALCIUM" in the last 72 hours. No results for input(s): "LABPT", "INR" in the last 72 hours.  Exam: General - Patient is Alert and Oriented Extremity - Neurologically intact Neurovascular intact Sensation intact distally Dorsiflexion/Plantar flexion intact Dressing/Incision - clean, dry, no drainage Motor Function - intact, moving foot and toes well on exam.  Past Medical History:  Diagnosis Date   ADD (attention deficit disorder)    Complication of anesthesia    woke up on table x 3   DDD (degenerative disc disease), cervical 04/06/2002   Diverticulosis    Fibromyalgia    Hemorrhoids    Pancreatic alpha-amylase deficiency (HCC)    Pneumonia     Assessment/Plan: 4 Days Post-Op Procedure(s)  (LRB): TOTAL KNEE ARTHROPLASTY (Left) Principal Problem:   Osteoarthritis of left knee  Estimated body mass index is 23.38 kg/m as calculated from the following:   Height as of this encounter: 5\' 9"  (1.753 m).   Weight as of this encounter: 71.8 kg.  DVT Prophylaxis - Aspirin Weight-bearing as tolerated.  Continue physical therapy today. Expected discharge home pending progress and if meeting patient goals. Scheduled for OPPT at [facility]. Follow-up in clinic in 2 weeks.  The PDMP database was reviewed today prior to any opioid medications being prescribed to this patient.  R. Arcola Jansky, PA-C Orthopedic Surgery 12/29/2022, 8:03 AM

## 2023-01-01 ENCOUNTER — Telehealth: Payer: Self-pay

## 2023-01-01 NOTE — Transitions of Care (Post Inpatient/ED Visit) (Signed)
01/01/2023  Name: Nicholas [name] MRN: [id number] DOB: 10/11/1955  Today's TOC FU Call Status: Today's TOC FU Call Status:: Unsuccessful Call (1st Attempt) Unsuccessful Call (1st Attempt) Date: 01/01/23  Attempted to reach the patient regarding the most recent Inpatient/ED visit.  Follow Up Plan: Additional outreach attempts will be made to reach the patient to complete the Transitions of Care (Post Inpatient/ED visit) call.   Antionette Fairy, RN,BSN,CCM RN Care Manager Transitions of Care  [facility]  Direct Phone: [phone] Toll Free: [phone] Fax: [phone]

## 2023-01-01 NOTE — Discharge Summary (Signed)
Physician Discharge Summary   Patient ID: Nicholas [name] MRN: [id number] DOB/AGE: 1955-08-05 67 y.o.  Admit date: 12/25/2022 Discharge date: 12/29/2022  Primary Diagnosis: Osteoarthritis, left knee   Admission Diagnoses:  Past Medical History:  Diagnosis Date   ADD (attention deficit disorder)    Complication of anesthesia    woke up on table x 3   DDD (degenerative disc disease), cervical 04/06/2002   Diverticulosis    Fibromyalgia    Hemorrhoids    Pancreatic alpha-amylase deficiency (HCC)    Pneumonia    Discharge Diagnoses:   Principal Problem:   Osteoarthritis of left knee  Estimated body mass index is 23.38 kg/m as calculated from the following:   Height as of this encounter: 5\' 9"  (1.753 m).   Weight as of this encounter: 71.8 kg.  Procedure:  Procedure(s) (LRB): TOTAL KNEE ARTHROPLASTY (Left)   Consults: None  HPI: The patient is a 67 year old male who has advanced end-stage arthritis of the left knee with significant pain and dysfunction.  He has a significant varus deformity and significant proximal medial tibial erosion.  He presents now for a total knee arthroplasty.  Laboratory Data: Admission on 12/25/2022, Discharged on 12/29/2022  Component Date Value Ref Range Status   WBC 12/26/2022 9.0  4.0 - 10.5 K/uL Final   RBC 12/26/2022 3.52 (L)  4.22 - 5.81 MIL/uL Final   Hemoglobin 12/26/2022 10.9 (L)  13.0 - 17.0 g/dL Final   HCT 56/21/3086 33.7 (L)  39.0 - 52.0 % Final   MCV 12/26/2022 95.7  80.0 - 100.0 fL Final   MCH 12/26/2022 31.0  26.0 - 34.0 pg Final   MCHC 12/26/2022 32.3  30.0 - 36.0 g/dL Final   RDW 57/84/6962 13.5  11.5 - 15.5 % Final   Platelets 12/26/2022 205  150 - 400 K/uL Final   nRBC 12/26/2022 0.0  0.0 - 0.2 % Final   Performed at [facility], [address], [place], [place] [address]   Sodium 12/26/2022 135  135 - 145 mmol/L Final   Potassium 12/26/2022 4.1  3.5 - 5.1 mmol/L Final   Chloride 12/26/2022 99  98  - 111 mmol/L Final   CO2 12/26/2022 29  22 - 32 mmol/L Final   Glucose, Bld 12/26/2022 119 (H)  70 - 99 mg/dL Final   Glucose reference range applies only to samples taken after fasting for at least 8 hours.   BUN 12/26/2022 12  8 - 23 mg/dL Final   Creatinine, Ser 12/26/2022 0.73  0.61 - 1.24 mg/dL Final   Calcium 13/24/4010 8.8 (L)  8.9 - 10.3 mg/dL Final   GFR, Estimated 12/26/2022 >60  >60 mL/min Final   Comment: (NOTE) Calculated using the CKD-EPI Creatinine Equation (2021)    Anion gap 12/26/2022 7  5 - 15 Final   Performed at [facility], [address] [place], [place] [address]   WBC 12/27/2022 9.3  4.0 - 10.5 K/uL Final   RBC 12/27/2022 3.40 (L)  4.22 - 5.81 MIL/uL Final   Hemoglobin 12/27/2022 10.4 (L)  13.0 - 17.0 g/dL Final   HCT 66/44/0347 32.1 (L)  39.0 - 52.0 % Final   MCV 12/27/2022 94.4  80.0 - 100.0 fL Final   MCH 12/27/2022 30.6  26.0 - 34.0 pg Final   MCHC 12/27/2022 32.4  30.0 - 36.0 g/dL Final   RDW 42/59/5638 13.2  11.5 - 15.5 % Final   Platelets 12/27/2022 185  150 - 400 K/uL Final  nRBC 12/27/2022 0.0  0.0 - 0.2 % Final   Performed at [facility], [address], [place], [place] [address]   Uric Acid, Serum 12/28/2022 4.5  3.7 - 8.6 mg/dL Final   Performed at [facility], [address] [place], [place] [address]  Hospital Outpatient Visit on 12/13/2022  Component Date Value Ref Range Status   MRSA, PCR 12/13/2022 NEGATIVE  NEGATIVE Final   Staphylococcus aureus 12/13/2022 NEGATIVE  NEGATIVE Final   Comment: (NOTE) The Xpert SA Assay (FDA approved for NASAL specimens in patients 85 years of age and older), is one component of a comprehensive surveillance program. It is not intended to diagnose infection nor to guide or monitor treatment. Performed at [facility], [address], [place], [place] [address]    WBC 12/13/2022 7.1  4.0 - 10.5 K/uL Final   RBC 12/13/2022  4.49  4.22 - 5.81 MIL/uL Final   Hemoglobin 12/13/2022 13.6  13.0 - 17.0 g/dL Final   HCT 13/10/6576 41.9  39.0 - 52.0 % Final   MCV 12/13/2022 93.3  80.0 - 100.0 fL Final   MCH 12/13/2022 30.3  26.0 - 34.0 pg Final   MCHC 12/13/2022 32.5  30.0 - 36.0 g/dL Final   RDW 46/96/2952 13.6  11.5 - 15.5 % Final   Platelets 12/13/2022 234  150 - 400 K/uL Final   nRBC 12/13/2022 0.0  0.0 - 0.2 % Final   Performed at [facility], [address], [place], [place] [address]     X-Rays:No results found.  EKG: Orders placed or performed in visit on 03/21/21   EKG     Hospital Course: Nicholas [name] is a 67 y.o. who was admitted to [facility]. They were brought to the operating room on 12/25/2022 and underwent Procedure(s): TOTAL KNEE ARTHROPLASTY.  Patient tolerated the procedure well and was later transferred to the recovery room and then to the orthopaedic floor for postoperative care. They were given PO and IV analgesics for pain control following their surgery. They were given 24 hours of postoperative antibiotics of  Anti-infectives (From admission, onward)    Start     Dose/Rate Route Frequency Ordered Stop   12/25/22 1400  ceFAZolin (ANCEF) IVPB 2g/100 mL premix        2 g 200 mL/hr over 30 Minutes Intravenous Every 6 hours 12/25/22 1149 12/25/22 1952   12/25/22 0615  ceFAZolin (ANCEF) IVPB 2g/100 mL premix        2 g 200 mL/hr over 30 Minutes Intravenous On call to O.R. 12/25/22 0602 12/25/22 4401      and started on DVT prophylaxis in the form of Aspirin.   PT and OT were ordered for total joint protocol. Discharge planning consulted to help with post-op disposition and equipment needs. Patient had a fair night on the evening of surgery. They started to get up OOB with physical therapy on POD #0. Continued to work with physical therapy into POD #4. Patient was seen during rounds on day four and was ready to go home pending progress with physical therapy.  Patient worked with physical therapy for a total of 9 sessions and was meeting their goals. Dressing was changed and the incision was C/D/I.  They were discharged home later that day in stable condition.  Diet: Regular diet Activity: WBAT Follow-up: in 2 weeks Disposition: Home Discharged Condition: stable   Discharge Instructions     Call MD / Call  911   Complete by: As directed    If you experience chest pain or shortness of breath, CALL 911 and be transported to the hospital emergency room.  If you develope a fever above 101 F, pus (white drainage) or increased drainage or redness at the wound, or calf pain, call your surgeon's office.   Change dressing   Complete by: As directed    You may remove the bulky bandage (ACE wrap and gauze) two days after surgery. You will have an adhesive waterproof bandage underneath. Leave this in place until your first follow-up appointment.   Constipation Prevention   Complete by: As directed    Drink plenty of fluids.  Prune juice may be helpful.  You may use a stool softener, such as Colace (over the counter) 100 mg twice a day.  Use MiraLax (over the counter) for constipation as needed.   Diet - low sodium heart healthy   Complete by: As directed    Do not put a pillow under the knee. Place it under the heel.   Complete by: As directed    Driving restrictions   Complete by: As directed    No driving for two weeks   Post-operative opioid taper instructions:   Complete by: As directed    POST-OPERATIVE OPIOID TAPER INSTRUCTIONS: It is important to wean off of your opioid medication as soon as possible. If you do not need pain medication after your surgery it is ok to stop day one. Opioids include: Codeine, Hydrocodone(Norco, Vicodin), Oxycodone(Percocet, oxycontin) and hydromorphone amongst others.  Long term and even short term use of opiods can cause: Increased pain response Dependence Constipation Depression Respiratory depression And  more.  Withdrawal symptoms can include Flu like symptoms Nausea, vomiting And more Techniques to manage these symptoms Hydrate well Eat regular healthy meals Stay active Use relaxation techniques(deep breathing, meditating, yoga) Do Not substitute Alcohol to help with tapering If you have been on opioids for less than two weeks and do not have pain than it is ok to stop all together.  Plan to wean off of opioids This plan should start within one week post op of your joint replacement. Maintain the same interval or time between taking each dose and first decrease the dose.  Cut the total daily intake of opioids by one tablet each day Next start to increase the time between doses. The last dose that should be eliminated is the evening dose.      TED hose   Complete by: As directed    Use stockings (TED hose) for three weeks on both leg(s).  You may remove them at night for sleeping.   Weight bearing as tolerated   Complete by: As directed       Allergies as of 12/29/2022       Reactions   Amoxicillin-pot Clavulanate Diarrhea, Nausea And Vomiting   Lactose Intolerance (gi)    Latex Itching   Nickel         Medication List     STOP taking these medications    diclofenac 75 MG EC tablet Commonly known as: VOLTAREN       TAKE these medications    amphetamine-dextroamphetamine 30 MG 24 hr capsule Commonly known as: ADDERALL XR Take 1 capsule (30 mg total) by mouth every morning.   Aspirin Low Dose 81 MG chewable tablet Generic drug: aspirin Chew 1 tablet (81 mg total) by mouth 2 (two) times daily for 17 days. Then take one 81 mg  aspirin once a day for three weeks. Then discontinue aspirin.   b complex vitamins capsule Take 1 capsule by mouth in the morning.   Cannabidiol Powd Apply 1 Application topically 3 (three) times daily as needed (knee/back pain.). CBD gel   CHLORPHENIRAMINE MALEATE PO Take 1 tablet by mouth in the morning.   diclofenac sodium 1 %  Gel Commonly known as: VOLTAREN Apply topically 4 (four) times daily.   gabapentin 300 MG capsule Commonly known as: NEURONTIN Take 1 capsule (300 mg total) by mouth 3 (three) times daily for 2 weeks following surgery.Then take 1 capsule capsule two times a day for two weeks. Then take 1 capsule once a day for two weeks. Then discontinue.   HYDROmorphone 2 MG tablet Commonly known as: Dilaudid Take 1-2 tablets (2-4 mg total) by mouth every 6 (six) hours as needed for up to 5 days for severe pain (pain score 7-10) (not responding to chronic oxy).   methocarbamol 500 MG tablet Commonly known as: ROBAXIN Take 1 tablet (500 mg total) by mouth every 6 (six) hours as needed for muscle spasms.   multivitamin with minerals Tabs tablet Take 1 tablet by mouth in the morning.   ondansetron 4 MG tablet Commonly known as: ZOFRAN Take 1 tablet (4 mg total) by mouth every 6 (six) hours as needed for nausea.   Oxycodone HCl 10 MG Tabs Take 10 mg by mouth 3 (three) times daily as needed (pain).   traMADol 50 MG tablet Commonly known as: ULTRAM Take 1-2 tablets (50-100 mg total) by mouth every 6 (six) hours as needed for moderate pain (pain score 4-6).   traZODone 50 MG tablet Commonly known as: DESYREL Take 50 mg by mouth at bedtime and may repeat dose one time if needed.   vitamin C 1000 MG tablet Take 1,000 mg by mouth in the morning.   zinc sulfate 220 (50 Zn) MG capsule Take 220 mg by mouth in the morning.               Discharge Care Instructions  (From admission, onward)           Start     Ordered   12/29/22 0000  Weight bearing as tolerated        12/29/22 0803   12/29/22 0000  Change dressing       Comments: You may remove the bulky bandage (ACE wrap and gauze) two days after surgery. You will have an adhesive waterproof bandage underneath. Leave this in place until your first follow-up appointment.   12/29/22 0803            Follow-up Information      Ollen Gross, MD Follow up in 2 week(s).   Specialty: Orthopedic Surgery Contact information: [address] [place] [address] [phone]                 Signed: R. Arcola Jansky, PA-C Orthopedic Surgery 01/01/2023, 8:35 AM

## 2023-01-02 ENCOUNTER — Telehealth: Payer: Self-pay

## 2023-01-02 DIAGNOSIS — M25562 Pain in left knee: Secondary | ICD-10-CM | POA: Diagnosis not present

## 2023-01-02 NOTE — Transitions of Care (Post Inpatient/ED Visit) (Signed)
01/02/2023  Name: Nicholas [name] MRN: [id number] DOB: 10-Aug-1955  Today's TOC FU Call Status: Today's TOC FU Call Status:: Unsuccessful Call (3rd Attempt) Unsuccessful Call (3rd Attempt) Date: 01/02/23  Attempted to reach the patient regarding the most recent Inpatient/ED visit.  Follow Up Plan: No further outreach attempts will be made at this time. We have been unable to contact the patient.    Antionette Fairy, RN,BSN,CCM RN Care Manager Transitions of Care  [facility]  Direct Phone: [phone] Toll Free: [phone] Fax: [phone]

## 2023-01-02 NOTE — Transitions of Care (Post Inpatient/ED Visit) (Signed)
01/02/2023  Name: ISAIR DEKKER MRN: [id number] DOB: 1955-05-09  Today's TOC FU Call Status: Today's TOC FU Call Status:: Unsuccessful Call (2nd Attempt) Unsuccessful Call (2nd Attempt) Date: 01/02/23  Attempted to reach the patient regarding the most recent Inpatient/ED visit.  Follow Up Plan: Additional outreach attempts will be made to reach the patient to complete the Transitions of Care (Post Inpatient/ED visit) call.   Antionette Fairy, RN,BSN,CCM RN Care Manager Transitions of Care  [facility]  Direct Phone: [phone] Toll Free: [phone] Fax: [phone]

## 2023-01-04 DIAGNOSIS — M25562 Pain in left knee: Secondary | ICD-10-CM | POA: Diagnosis not present

## 2023-01-09 DIAGNOSIS — M25562 Pain in left knee: Secondary | ICD-10-CM | POA: Diagnosis not present

## 2023-01-11 DIAGNOSIS — M25562 Pain in left knee: Secondary | ICD-10-CM | POA: Diagnosis not present

## 2023-01-17 DIAGNOSIS — M25562 Pain in left knee: Secondary | ICD-10-CM | POA: Diagnosis not present

## 2023-01-19 DIAGNOSIS — M25562 Pain in left knee: Secondary | ICD-10-CM | POA: Diagnosis not present

## 2023-01-23 DIAGNOSIS — M25562 Pain in left knee: Secondary | ICD-10-CM | POA: Diagnosis not present

## 2023-01-26 ENCOUNTER — Emergency Department (HOSPITAL_COMMUNITY)
Admission: EM | Admit: 2023-01-26 | Discharge: 2023-01-27 | Disposition: A | Discharge status: Home / Self Care | Payer: PPO | Attending: Emergency Medicine | Admitting: Emergency Medicine

## 2023-01-26 ENCOUNTER — Other Ambulatory Visit: Payer: Self-pay

## 2023-01-26 ENCOUNTER — Emergency Department (HOSPITAL_COMMUNITY): Payer: PPO

## 2023-01-26 ENCOUNTER — Encounter (HOSPITAL_COMMUNITY): Payer: Self-pay

## 2023-01-26 DIAGNOSIS — M4802 Spinal stenosis, cervical region: Secondary | ICD-10-CM | POA: Diagnosis not present

## 2023-01-26 DIAGNOSIS — M5030 Other cervical disc degeneration, unspecified cervical region: Secondary | ICD-10-CM | POA: Diagnosis not present

## 2023-01-26 DIAGNOSIS — M542 Cervicalgia: Secondary | ICD-10-CM | POA: Diagnosis not present

## 2023-01-26 DIAGNOSIS — M5021 Other cervical disc displacement,  high cervical region: Secondary | ICD-10-CM | POA: Diagnosis not present

## 2023-01-26 DIAGNOSIS — Z91040 Latex allergy status: Secondary | ICD-10-CM | POA: Diagnosis not present

## 2023-01-26 DIAGNOSIS — M50223 Other cervical disc displacement at C6-C7 level: Secondary | ICD-10-CM | POA: Diagnosis not present

## 2023-01-26 LAB — CBC WITH DIFFERENTIAL/PLATELET
Abs Immature Granulocytes: 0.02 10*3/uL (ref 0.00–0.07)
Basophils Absolute: 0.1 10*3/uL (ref 0.0–0.1)
Basophils Relative: 1 %
Eosinophils Absolute: 0.1 10*3/uL (ref 0.0–0.5)
Eosinophils Relative: 1 %
HCT: 34.9 % — ABNORMAL LOW (ref 39.0–52.0)
Hemoglobin: 11.4 g/dL — ABNORMAL LOW (ref 13.0–17.0)
Immature Granulocytes: 0 %
Lymphocytes Relative: 11 %
Lymphs Abs: 1.1 10*3/uL (ref 0.7–4.0)
MCH: 29 pg (ref 26.0–34.0)
MCHC: 32.7 g/dL (ref 30.0–36.0)
MCV: 88.8 fL (ref 80.0–100.0)
Monocytes Absolute: 1.1 10*3/uL — ABNORMAL HIGH (ref 0.1–1.0)
Monocytes Relative: 11 %
Neutro Abs: 7.2 10*3/uL (ref 1.7–7.7)
Neutrophils Relative %: 76 %
Platelets: 295 10*3/uL (ref 150–400)
RBC: 3.93 MIL/uL — ABNORMAL LOW (ref 4.22–5.81)
RDW: 13.3 % (ref 11.5–15.5)
WBC: 9.4 10*3/uL (ref 4.0–10.5)
nRBC: 0 % (ref 0.0–0.2)

## 2023-01-26 LAB — BASIC METABOLIC PANEL
Anion gap: 10 (ref 5–15)
BUN: 13 mg/dL (ref 8–23)
CO2: 26 mmol/L (ref 22–32)
Calcium: 8.8 mg/dL — ABNORMAL LOW (ref 8.9–10.3)
Chloride: 97 mmol/L — ABNORMAL LOW (ref 98–111)
Creatinine, Ser: 0.66 mg/dL (ref 0.61–1.24)
GFR, Estimated: >60 mL/min (ref >60)
Glucose, Bld: 120 mg/dL — ABNORMAL HIGH (ref 70–99)
Potassium: 4 mmol/L (ref 3.5–5.1)
Sodium: 133 mmol/L — ABNORMAL LOW (ref 135–145)

## 2023-01-26 MED ORDER — LIDOCAINE 5 % EX PTCH: 1.0000 | MEDICATED_PATCH | CUTANEOUS | 0 refills | Status: AC

## 2023-01-26 MED ORDER — DIAZEPAM 5 MG/ML IJ SOLN
2.5000 mg | Freq: Once | INTRAMUSCULAR | Status: AC
  Administered 2023-01-26: 2.5 mg via INTRAVENOUS
  Filled 2023-01-26: qty 2

## 2023-01-26 MED ORDER — FENTANYL CITRATE PF 50 MCG/ML IJ SOSY
50.0000 ug | PREFILLED_SYRINGE | Freq: Once | INTRAMUSCULAR | Status: AC
  Administered 2023-01-26: 50 ug via INTRAVENOUS
  Filled 2023-01-26: qty 1

## 2023-01-26 MED ORDER — LIDOCAINE 5 % EX PTCH
1.0000 | MEDICATED_PATCH | CUTANEOUS | Status: DC
  Administered 2023-01-26: 1 via TRANSDERMAL
  Filled 2023-01-26: qty 1

## 2023-01-26 MED ORDER — DEXAMETHASONE SODIUM PHOSPHATE 10 MG/ML IJ SOLN
6.0000 mg | Freq: Once | INTRAMUSCULAR | Status: AC
  Administered 2023-01-26: 6 mg via INTRAVENOUS
  Filled 2023-01-26: qty 1

## 2023-01-26 MED ORDER — METHOCARBAMOL 500 MG PO TABS: 500.0000 mg | ORAL_TABLET | Freq: Two times a day (BID) | ORAL | 0 refills | Status: DC

## 2023-01-26 MED ORDER — HYDROMORPHONE HCL 1 MG/ML IJ SOLN
1.0000 mg | Freq: Once | INTRAMUSCULAR | Status: AC
  Administered 2023-01-26: 1 mg via INTRAVENOUS
  Filled 2023-01-26: qty 1

## 2023-01-26 NOTE — ED Triage Notes (Signed)
C/o left neck pain x3 days that is worse with movement.  Denies headache/back pain Denies fall/trauma

## 2023-01-26 NOTE — Discharge Instructions (Addendum)
Return for any problem.  ?

## 2023-01-26 NOTE — ED Provider Notes (Signed)
[facility] AT [facility] Provider Note   CSN: [id number] Arrival date & time: 01/26/23  1835     History  Chief Complaint  Patient presents with   Torticollis    Nicholas [name] is a 67 y.o. male.  67 year old male with prior medical history detailed below presents for evaluation.  Patient complains of diffuse posterior neck pain [name] 3 to 4 days.  Patient's pain is worse with any movement.  He denies any specific inciting event.  He denies associated headache, nausea, vomiting, fever, focal weakness.  He takes narcotics on a regular basis.  He reports that his chronic pain medications have not adequately controlled his pain.  The history is provided by the patient and medical records.       Home Medications Prior to Admission medications   Medication Sig Start Date End Date Taking? Authorizing Provider  amphetamine-dextroamphetamine (ADDERALL XR) 30 MG 24 hr capsule Take 1 capsule (30 mg total) by mouth every morning. 11/17/22   Ronnald Nian, MD  Ascorbic Acid (VITAMIN C) 1000 MG tablet Take 1,000 mg by mouth in the morning.    [provider]  b complex vitamins capsule Take 1 capsule by mouth in the morning.    [provider]  Cannabidiol POWD Apply 1 Application topically 3 (three) times daily as needed (knee/back pain.). CBD gel Patient not taking: Reported on 12/13/2022    [provider]  CHLORPHENIRAMINE MALEATE PO Take 1 tablet by mouth in the morning.    [provider]  diclofenac sodium (VOLTAREN) 1 % GEL Apply topically 4 (four) times daily.    [provider]  gabapentin (NEURONTIN) 300 MG capsule Take 1 capsule (300 mg total) by mouth 3 (three) times daily for 2 weeks following surgery.Then take 1 capsule capsule two times a day for two weeks. Then take 1 capsule once a day for two weeks. Then discontinue. 12/29/22   [name], PA  methocarbamol (ROBAXIN) 500 MG tablet Take 1  tablet (500 mg total) by mouth every 6 (six) hours as needed for muscle spasms. 12/29/22   [name], PA  Multiple Vitamin (MULTIVITAMIN WITH MINERALS) TABS tablet Take 1 tablet by mouth in the morning.    [provider]  ondansetron (ZOFRAN) 4 MG tablet Take 1 tablet (4 mg total) by mouth every 6 (six) hours as needed for nausea. 12/29/22   [name], PA  Oxycodone HCl 10 MG TABS Take 10 mg by mouth 3 (three) times daily as needed (pain).    [provider]  traMADol (ULTRAM) 50 MG tablet Take 1-2 tablets (50-100 mg total) by mouth every 6 (six) hours as needed for moderate pain (pain score 4-6). 12/29/22   [name], PA  traZODone (DESYREL) 50 MG tablet Take 50 mg by mouth at bedtime and may repeat dose one time if needed. 01/23/22   [provider]  zinc sulfate 220 (50 Zn) MG capsule Take 220 mg by mouth in the morning.    [provider]      Allergies    Amoxicillin-pot clavulanate, Lactose intolerance (gi), Latex, and Nickel    Review of Systems   Review of Systems  All other systems reviewed and are negative.   Physical Exam Updated Vital Signs BP (!) 164/101 (BP Location: Right Arm)   Pulse 89   Temp 99 F (37.2 C) (Oral)   Resp 18   Wt 71 kg   SpO2 98%  BMI 23.11 kg/m  Physical Exam Vitals and nursing note reviewed.  Constitutional:      General: He is not in acute distress.    Appearance: Normal appearance. He is well-developed.  HENT:     Head: Normocephalic and atraumatic.  Eyes:     Conjunctiva/sclera: Conjunctivae normal.     Pupils: Pupils are equal, round, and reactive to light.  Neck:     Comments: Muscular spasm appreciated with palpation of the patient's posterior cervical neck musculature.  Patient's exam is consistent with likely muscular torticollis. Cardiovascular:     Rate and Rhythm: Normal rate and regular rhythm.     Heart sounds: Normal heart sounds.  Pulmonary:     Effort: Pulmonary  effort is normal. No respiratory distress.     Breath sounds: Normal breath sounds.  Abdominal:     General: There is no distension.     Palpations: Abdomen is soft.     Tenderness: There is no abdominal tenderness.  Musculoskeletal:        General: No deformity. Normal range of motion.  Skin:    General: Skin is warm and dry.  Neurological:     General: No focal deficit present.     Mental Status: He is alert and oriented to person, place, and time.     ED Results / Procedures / Treatments   Labs (all labs ordered are listed, but only abnormal results are displayed) Labs Reviewed  BASIC METABOLIC PANEL - Abnormal; Notable for the following components:      Result Value   Sodium 133 (*)    Chloride 97 (*)    Glucose, Bld 120 (*)    Calcium 8.8 (*)    All other components within normal limits  CBC WITH DIFFERENTIAL/PLATELET - Abnormal; Notable for the following components:   RBC 3.93 (*)    Hemoglobin 11.4 (*)    HCT 34.9 (*)    Monocytes Absolute 1.1 (*)    All other components within normal limits    EKG None  Radiology CT Cervical Spine Wo Contrast  Result Date: 01/26/2023 CLINICAL DATA:  Left-sided neck pain. EXAM: CT CERVICAL SPINE WITHOUT CONTRAST TECHNIQUE: Multidetector CT imaging of the cervical spine was performed without intravenous contrast. Multiplanar CT image reconstructions were also generated. RADIATION DOSE REDUCTION: This exam was performed according to the departmental dose-optimization program which includes automated exposure control, adjustment of the mA and/or kV according to patient size and/or use of iterative reconstruction technique. COMPARISON:  None Available. FINDINGS: Alignment: 1-2 mm anterolisthesis of C3 on C4, 3 mm anterolisthesis C4 on C5. Skull base and vertebrae: No acute fracture. No primary bone lesion or focal pathologic process. Soft tissues and spinal canal: No prevertebral fluid or swelling. No visible canal hematoma. Disc levels:  C2-C3: Minimal broad-based disc bulge. Facet hypertrophy on the right causes bony neural foraminal narrowing. C3-C4: Prominent left facet hypertrophy causes bony neural foraminal stenosis. No canal stenosis. C4-C5: Moderate to advanced bilateral facet hypertrophy cause bilateral neural foraminal stenosis. C5-C6: Posterior disc osteophyte complex. Spurring causes mild left bony foraminal stenosis. C6-C7: Broad-based disc bulge. No high-grade canal or neural foraminal stenosis. C7-T1: Broad-based disc bulge. No high-grade canal or neural foraminal stenosis. Upper chest: No acute findings Other: Carotid calcifications IMPRESSION: 1. No acute findings in the cervical spine. 2. Multilevel degenerative disc disease and facet hypertrophy. 3. Multilevel neural foraminal stenosis, most prominent at C4-C5 bilaterally. Electronically Signed   By: Narda Rutherford M.D.   On: 01/26/2023 21:15  Procedures Procedures    Medications Ordered in ED Medications  lidocaine (LIDODERM) 5 % 1 patch (1 patch Transdermal Patch Applied 01/26/23 2201)  HYDROmorphone (DILAUDID) injection 1 mg (has no administration in time range)  dexamethasone (DECADRON) injection 6 mg (has no administration in time range)  diazepam (VALIUM) injection 2.5 mg (2.5 mg Intravenous Given 01/26/23 2135)  fentaNYL (SUBLIMAZE) injection 50 mcg (50 mcg Intravenous Given 01/26/23 2135)    ED Course/ Medical Decision Making/ A&P                                 Medical Decision Making Amount and/or Complexity of Data Reviewed Labs: ordered. Radiology: ordered.  Risk Prescription drug management.    Medical Screen Complete  This patient presented to the ED with complaint of neck pain.  This complaint involves an extensive number of treatment options. The initial differential diagnosis includes, but is not limited to, torticollis  This presentation is: Acute, Self-Limited, Previously Undiagnosed, Uncertain Prognosis, Complicated,  Systemic Symptoms, and Threat to Life/Bodily Function  Patient is presenting with complaint of cervical neck pain.  Imaging does not reveal acute abnormality.  Screening labs obtained are without significant abnormality.     Patient is feeling much improved after administration of muscle relaxant, narcotic, steroid, application of Lidoderm patch.  He understands need for close outpatient follow-up.  Strict return precautions given and understood.  Additional history obtained:  External records from outside sources obtained and reviewed including prior ED visits and prior Inpatient records.    Lab Tests:  I ordered and personally interpreted labs.  The pertinent results include: CBC, BMP   Imaging Studies ordered:  I ordered imaging studies including CT cervical spine I independently visualized and interpreted obtained imaging which showed NAD I agree with the radiologist interpretation.  Medicines ordered:  I ordered medication including Dilaudid, Decadron, Valium, fentanyl, Lidoderm patch for pain Reevaluation of the patient after these medicines showed that the patient: improved   Problem List / ED Course:  Torticollis   Reevaluation:  After the interventions noted above, I reevaluated the patient and found that they have: improved   Disposition:  After consideration of the diagnostic results and the patients response to treatment, I feel that the patent would benefit from close outpatient follow-up.          Final Clinical Impression(s) / ED Diagnoses Final diagnoses:  Neck pain    Rx / DC Orders ED Discharge Orders          Ordered    methocarbamol (ROBAXIN) 500 MG tablet  2 times daily        01/26/23 2323    lidocaine (LIDODERM) 5 %  Every 24 hours        01/26/23 2323              Wynetta Fines, MD 01/26/23 [phone]

## 2023-01-28 NOTE — Progress Notes (Unsigned)
No chief complaint on file.  He went to ER 11/22 with diffuse posterior neck pain x 3 to 4 days.  Pain was worse with any movement, and not adequately controlled by his chronic narcotics.  He denied any specific inciting event, no trauma or change in activity.  No associated headache, nausea, vomiting, fever, focal weakness. Diagnosed with torticollis. Had baseline labs and CT of neck (see results below). Pain improved after administration of muscle relaxant (valium), narcotic (fentanyl, dilaudid), steroid (decadron), application of Lidoderm patch. Rx'd lidoderm patch, refilled methocarbamol    PMH, PSH, SH reviewed   ROS:    PHYSICAL EXAM:  There were no vitals taken for this visit.      ASSESSMENT/PLAN:   Appt made 11/22, went to ER that evening   ER visit reviewed. Results: CT neck: IMPRESSION: 1. No acute findings in the cervical spine. 2. Multilevel degenerative disc disease and facet hypertrophy. 3. Multilevel neural foraminal stenosis, most prominent at C4-C5 bilaterally.  Cbc--Hgb lower, but improving.  Had L TKR 12/25/2022  Component Ref Range & Units 2 d ago (01/26/23) 1 mo ago (12/27/22) 1 mo ago (12/26/22) 1 mo ago (12/13/22) 11 mo ago (02/23/22) 1 yr ago (09/13/21) 2 yr ago (04/23/20)  WBC 4.0 - 10.5 K/uL 9.4 9.3 9.0 7.1 4.2 R 5.4 R 5.6 R  RBC 4.22 - 5.81 MIL/uL 3.93 Low  3.40 Low  3.52 Low  4.49 4.31 R 4.57 R 4.73 R  Hemoglobin 13.0 - 17.0 g/dL 57.8 Low  46.9 Low  62.9 Low  13.6 13.3 R 13.9 R 14.6 R  HCT 39.0 - 52.0 % 34.9 Low  32.1 Low  33.7 Low  41.9 39.0 R 41.9 R 43.3 R  MCV 80.0 - 100.0 fL 88.8 94.4 95.7 93.3 90.5 92 R 92 R           Component Ref Range & Units 2 d ago 1 mo ago 1 yr ago 2 yr ago 6 yr ago 12 yr ago  Sodium 135 - 145 mmol/L 133 Low  135 141 R 142 R 138 R 139 R  Potassium 3.5 - 5.1 mmol/L 4.0 4.1 4.4 R 4.5 R 4.5 R 4.3 R  Chloride 98 - 111 mmol/L 97 Low  99 102 R 102 R 102 R 102 R  CO2 22 - 32 mmol/L 26 29 23  R 24 R 24 R  27 R  Glucose, Bld 70 - 99 mg/dL 528 High  413 High  CM 100 High  100 High  R 92 R, CM 86  Comment: Glucose reference range applies only to samples taken after fasting for at least 8 hours.  BUN 8 - 23 mg/dL 13 12 20  R 14 R 13 R 18 R  Creatinine, Ser 0.61 - 1.24 mg/dL 2.44 0.10 2.72 R 5.36 R 0.93 R, CM 0.92 R, CM  Calcium 8.9 - 10.3 mg/dL 8.8 Low  8.8 Low  9.5 R 9.7 R 9.0 R 9.8 R  GFR, Estimated >60 mL/min >60 >60 CM

## 2023-01-29 ENCOUNTER — Encounter: Payer: Self-pay | Admitting: Family Medicine

## 2023-01-29 ENCOUNTER — Ambulatory Visit (INDEPENDENT_AMBULATORY_CARE_PROVIDER_SITE_OTHER): Payer: PPO | Admitting: Family Medicine

## 2023-01-29 VITALS — BP 132/80 | HR 68 | Ht 70.0 in | Wt 151.4 lb

## 2023-01-29 DIAGNOSIS — F988 Other specified behavioral and emotional disorders with onset usually occurring in childhood and adolescence: Secondary | ICD-10-CM | POA: Diagnosis not present

## 2023-01-29 DIAGNOSIS — G8929 Other chronic pain: Secondary | ICD-10-CM

## 2023-01-29 DIAGNOSIS — R634 Abnormal weight loss: Secondary | ICD-10-CM

## 2023-01-29 DIAGNOSIS — M62838 Other muscle spasm: Secondary | ICD-10-CM

## 2023-01-29 DIAGNOSIS — M545 Low back pain, unspecified: Secondary | ICD-10-CM

## 2023-01-29 MED ORDER — CYCLOBENZAPRINE HCL 5 MG PO TABS: 5.0000 mg | ORAL_TABLET | Freq: Three times a day (TID) | ORAL | 0 refills | Status: DC | PRN

## 2023-01-29 NOTE — Patient Instructions (Addendum)
Be sure that are getting adequate food/caloric intake in order to maintain your weight. This is especially important on the days you take Adderall (when your appetite may be less). Make sure you have frequent snacks (whether they be protein bars, supplement shakes, etc).  Avoid prolonged hot showers, which dry out the skin on your back, and makes the itching worse. Try and moisturize regularly. You may use hydrocortisone if needed for the current itching/rash.  We are going to try a different muscle relaxant.  It may make you sleepy, especially when being used with narcotic pain medications.  I'm starting with the lower dose for this reason. Do not use the methocarbamol along with the new muscle relaxant.    Use SalonPas with lidocaine (insurance doesn't cover the prescription they wrote, this is very similar).  I recommend moist heat and stretches 3 times/day, along with massage.  Do the neck stretches as shown at least 3 times daily, AFTER heat.  Chin to chest, ear to shoulder and looking over the shoulder were the stretches shown. Do them 10 times each, holding each position for 10-15 seconds.

## 2023-01-30 DIAGNOSIS — M25562 Pain in left knee: Secondary | ICD-10-CM | POA: Diagnosis not present

## 2023-01-30 DIAGNOSIS — Z5189 Encounter for other specified aftercare: Secondary | ICD-10-CM | POA: Diagnosis not present

## 2023-01-30 LAB — TSH: TSH: 1.8 u[IU]/mL (ref 0.450–4.500)

## 2023-02-06 DIAGNOSIS — M25562 Pain in left knee: Secondary | ICD-10-CM | POA: Diagnosis not present

## 2023-02-08 DIAGNOSIS — M25562 Pain in left knee: Secondary | ICD-10-CM | POA: Diagnosis not present

## 2023-02-12 DIAGNOSIS — M25562 Pain in left knee: Secondary | ICD-10-CM | POA: Diagnosis not present

## 2023-02-12 DIAGNOSIS — M47816 Spondylosis without myelopathy or radiculopathy, lumbar region: Secondary | ICD-10-CM | POA: Diagnosis not present

## 2023-02-12 DIAGNOSIS — M25561 Pain in right knee: Secondary | ICD-10-CM | POA: Diagnosis not present

## 2023-02-12 DIAGNOSIS — M961 Postlaminectomy syndrome, not elsewhere classified: Secondary | ICD-10-CM | POA: Diagnosis not present

## 2023-02-13 DIAGNOSIS — M25562 Pain in left knee: Secondary | ICD-10-CM | POA: Diagnosis not present

## 2023-02-15 DIAGNOSIS — M25562 Pain in left knee: Secondary | ICD-10-CM | POA: Diagnosis not present

## 2023-02-19 DIAGNOSIS — M25562 Pain in left knee: Secondary | ICD-10-CM | POA: Diagnosis not present

## 2023-02-22 DIAGNOSIS — M1711 Unilateral primary osteoarthritis, right knee: Secondary | ICD-10-CM | POA: Diagnosis not present

## 2023-02-22 DIAGNOSIS — M25562 Pain in left knee: Secondary | ICD-10-CM | POA: Diagnosis not present

## 2023-02-27 DIAGNOSIS — M25562 Pain in left knee: Secondary | ICD-10-CM | POA: Diagnosis not present

## 2023-03-28 ENCOUNTER — Telehealth: Payer: Self-pay | Admitting: Family Medicine

## 2023-03-28 DIAGNOSIS — F902 Attention-deficit hyperactivity disorder, combined type: Secondary | ICD-10-CM

## 2023-03-28 MED ORDER — AMPHETAMINE-DEXTROAMPHET ER 30 MG PO CP24: 30.0000 mg | ORAL_CAPSULE | ORAL | 0 refills | Status: DC

## 2023-03-28 NOTE — Telephone Encounter (Signed)
Pt called for refill of adderall 30 mg XR to [facility] [facility]

## 2023-04-13 DIAGNOSIS — Z96652 Presence of left artificial knee joint: Secondary | ICD-10-CM | POA: Diagnosis not present

## 2023-04-13 DIAGNOSIS — Z5189 Encounter for other specified aftercare: Secondary | ICD-10-CM | POA: Diagnosis not present

## 2023-04-17 ENCOUNTER — Ambulatory Visit: Payer: PPO

## 2023-04-17 DIAGNOSIS — Z0000 Encounter for general adult medical examination without abnormal findings: Secondary | ICD-10-CM | POA: Diagnosis not present

## 2023-04-17 NOTE — Patient Instructions (Signed)
Mr. Mccartin , Thank you for taking time to come for your Medicare Wellness Visit. I appreciate your ongoing commitment to your health goals. Please review the following plan we discussed and let me know if I can assist you in the future.   Referrals/Orders/Follow-Ups/Clinician Recommendations: none  This is a list of the screening recommended for you and due dates:  Health Maintenance  Topic Date Due   Pneumonia Vaccine (1 of 1 - PCV) Never done   COVID-19 Vaccine (2 - Janssen risk series) 02/04/2020   Flu Shot  06/04/2023*   Cologuard (Stool DNA test)  05/26/2023   Medicare Annual Wellness Visit  04/16/2024   DTaP/Tdap/Td vaccine (4 - Td or Tdap) 12/14/2028   Hepatitis C Screening  Completed   Zoster (Shingles) Vaccine  Completed   HPV Vaccine  Aged Out  *Topic was postponed. The date shown is not the original due date.    Advanced directives: (ACP Link)Information on Advanced Care Planning can be found at [facility] Directives Advance Health Care Directives ([email or web address]   Next Medicare Annual Wellness Visit scheduled for next year: No, will schedule next year  insert Preventive Care attachment Insert FALL PREVENTION attachment if needed

## 2023-04-17 NOTE — Progress Notes (Signed)
Subjective:   Nicholas [name] is a 68 y.o. male who presents for an Initial Medicare Annual Wellness Visit.  Visit Complete: Virtual I connected with  Nicholas [name] on 04/17/23 by a audio enabled telemedicine application and verified that I am speaking with the correct person using two identifiers. Interactive audio and video telecommunications were attempted between this provider and patient, however failed, due to patient having technical difficulties OR patient did not have access to video capability.  We continued and completed visit with audio only.  Patient Location: Home  Provider Location: Office/Clinic  I discussed the limitations of evaluation and management by telemedicine. The patient expressed understanding and agreed to proceed.  Vital Signs: Because this visit was a virtual/telehealth visit, some criteria may be missing or patient reported. Any vitals not documented were not able to be obtained and vitals that have been documented are patient reported.    Cardiac Risk Factors include: advanced age (>33men, >51 women);male gender     Objective:    Today's Vitals   04/17/23 1337  PainSc: 7    There is no height or weight on file to calculate BMI.     04/17/2023    1:43 PM 01/26/2023    6:58 PM 12/25/2022   11:00 AM 12/13/2022    9:09 AM  Advanced Directives  Does Patient Have a Medical Advance Directive? No No No No  Would patient like information on creating a medical advance directive? No - Patient declined No - Patient declined No - Patient declined     Current Medications (verified) Outpatient Encounter Medications as of 04/17/2023  Medication Sig   amphetamine-dextroamphetamine (ADDERALL XR) 30 MG 24 hr capsule Take 1 capsule (30 mg total) by mouth every morning.   Ascorbic Acid (VITAMIN C) 1000 MG tablet Take 1,000 mg by mouth in the morning.   b complex vitamins capsule Take 1 capsule by mouth in the morning.   CHLORPHENIRAMINE MALEATE PO Take 1  tablet by mouth in the morning.   gabapentin (NEURONTIN) 300 MG capsule Take 1 capsule (300 mg total) by mouth 3 (three) times daily for 2 weeks following surgery.Then take 1 capsule capsule two times a day for two weeks. Then take 1 capsule once a day for two weeks. Then discontinue.   Multiple Vitamin (MULTIVITAMIN WITH MINERALS) TABS tablet Take 1 tablet by mouth in the morning.   Oxycodone HCl 10 MG TABS Take 10 mg by mouth 3 (three) times daily as needed (pain).   traZODone (DESYREL) 50 MG tablet Take 50 mg by mouth at bedtime and may repeat dose one time if needed.   zinc sulfate 220 (50 Zn) MG capsule Take 220 mg by mouth in the morning.   Cannabidiol POWD Apply 1 Application topically 3 (three) times daily as needed (knee/back pain.). CBD gel (Patient not taking: Reported on 04/17/2023)   cyclobenzaprine (FLEXERIL) 5 MG tablet Take 1-2 tablets (5-10 mg total) by mouth 3 (three) times daily as needed for muscle spasms. (Patient not taking: Reported on 04/17/2023)   diclofenac sodium (VOLTAREN) 1 % GEL Apply topically 4 (four) times daily. (Patient not taking: Reported on 01/29/2023)   lidocaine (LIDODERM) 5 % Place 1 patch onto the skin daily. Remove & Discard patch within 12 hours or as directed by MD (Patient not taking: Reported on 04/17/2023)   methocarbamol (ROBAXIN) 500 MG tablet Take 1 tablet (500 mg total) by mouth 2 (two) times daily. (Patient not taking: Reported on 04/17/2023)   traMADol (  ULTRAM) 50 MG tablet Take 1-2 tablets (50-100 mg total) by mouth every 6 (six) hours as needed for moderate pain (pain score 4-6). (Patient not taking: Reported on 04/17/2023)   No facility-administered encounter medications on file as of 04/17/2023.    Allergies (verified) Amoxicillin-pot clavulanate, Lactose intolerance (gi), Latex, and Nickel   History: Past Medical History:  Diagnosis Date   ADD (attention deficit disorder)    Complication of anesthesia    woke up on table x 3   DDD  (degenerative disc disease), cervical 04/06/2002   Diverticulosis    Fibromyalgia    Hemorrhoids    Pancreatic alpha-amylase deficiency (HCC)    Pneumonia    Past Surgical History:  Procedure Laterality Date   ight shoulder rotator cuff surgery      inguinal hernia surgery      LAMINECTOMY Bilateral    decompression od L3-L4, L4-L5 with bilateral foraminotomies via L4 laminectomy with undercutting L3 and L5 lamina.   left knee surgery x 3      LUMBAR DISC SURGERY Left    Left L3-L4 diskectomy   NASAL SINUS SURGERY     right knee surgery x 2      TOTAL KNEE ARTHROPLASTY Left 12/25/2022   Procedure: TOTAL KNEE ARTHROPLASTY;  Surgeon: Ollen Gross, MD;  Location: [facility];  Service: Orthopedics;  Laterality: Left;   Family History  Problem Relation Age of Onset   Arthritis Mother    Arthritis Maternal Grandmother    Heart disease Neg Hx    Hyperlipidemia Neg Hx    Hypertension Neg Hx    Stroke Neg Hx    Cancer Neg Hx    Neuromuscular disorder Neg Hx    Social History   Socioeconomic History   Marital status: Divorced    Spouse name: separated. lives alone   Number of children: Not on file   Years of education: Not on file   Highest education level: Not on file  Occupational History   Occupation: freelance broadcast sports    Employer: [employer]  Tobacco Use   Smoking status: Never    Passive exposure: Never   Smokeless tobacco: Never  Vaping Use   Vaping status: Never Used  Substance and Sexual Activity   Alcohol use: Yes    Alcohol/week: 7.0 standard drinks of alcohol    Types: 7 Standard drinks or equivalent per week    Comment: daily   Drug use: Yes    Types: Oxycodone   Sexual activity: Not Currently    Comment: works in television, golf, married;   Other Topics Concern   Not on file  Social History Narrative   Not on file   Social Drivers of Health   Financial Resource Strain: Low Risk  (04/17/2023)   Overall Financial Resource Strain (CARDIA)     Difficulty of Paying Living Expenses: Not hard at all  Food Insecurity: No Food Insecurity (04/17/2023)   Hunger Vital Sign    Worried About Running Out of Food in the Last Year: Never true    Ran Out of Food in the Last Year: Never true  Transportation Needs: No Transportation Needs (04/17/2023)   PRAPARE - Administrator, Civil Service (Medical): No    Lack of Transportation (Non-Medical): No  Physical Activity: Sufficiently Active (04/17/2023)   Exercise Vital Sign    Days of Exercise per Week: 5 days    Minutes of Exercise per Session: 60 min  Stress: No Stress Concern Present (04/17/2023)  [employer] of Occupational Health - Occupational Stress Questionnaire    Feeling of Stress : Not at all  Social Connections: Unknown (04/17/2023)   Social Connection and Isolation Panel [NHANES]    Frequency of Communication with Friends and Family: More than three times a week    Frequency of Social Gatherings with Friends and Family: More than three times a week    Attends Religious Services: Not on Marketing executive or Organizations: Yes    Attends Engineer, structural: More than 4 times per year    Marital Status: Divorced    Tobacco Counseling Counseling given: Not Answered   Clinical Intake:  Pre-visit preparation completed: Yes  Pain : 0-10 Pain Score: 7  Pain Type: Chronic pain Pain Location: Back Pain Orientation: Lower Pain Descriptors / Indicators: Aching Pain Onset: More than a month ago Pain Frequency: Constant     Nutritional Risks: None Diabetes: No  How often do you need to have someone help you when you read instructions, pamphlets, or other written materials from your doctor or pharmacy?: 1 - Never  Interpreter Needed?: No  Information entered by :: NAllen LPN   Activities of Daily Living    04/17/2023    1:38 PM 12/25/2022   11:49 AM  In your present state of health, do you have any difficulty performing the  following activities:  Hearing? 0 0  Vision? 0 0  Difficulty concentrating or making decisions? 0 0  Walking or climbing stairs? 1   Comment sometimes   Dressing or bathing? 0   Doing errands, shopping? 0 0  Preparing Food and eating ? N   Using the Toilet? N   In the past six months, have you accidently leaked urine? N   Do you have problems with loss of bowel control? N   Managing your Medications? N   Managing your Finances? N   Housekeeping or managing your Housekeeping? N     Patient Care Team: Ronnald Nian, MD as PCP - General (Family Medicine) Sheran Luz, MD as Consulting Physician (Physical Medicine and Rehabilitation)  Indicate any recent Medical Services you may have received from other than [facility] providers in the past year (date may be approximate).     Assessment:   This is a routine wellness examination for Nicholas [name].  Hearing/Vision screen Hearing Screening - Comments:: Denies hearing issues Vision Screening - Comments:: Regular eye exams, [facility]   Goals Addressed             This Visit's Progress    Patient Stated       04/17/2023, gain strength back       Depression Screen    04/17/2023    1:44 PM 09/13/2021   12:21 PM 04/23/2020    2:08 PM 01/13/2020   12:40 PM 09/19/2016   11:42 AM  PHQ 2/9 Scores  PHQ - 2 Score 0 3 5 0 0  PHQ- 9 Score  11 11      Fall Risk    04/17/2023    1:43 PM 06/27/2022    8:42 AM 09/13/2021   12:26 PM 04/23/2020    2:11 PM 09/19/2016   11:42 AM  Fall Risk   Falls in the past year? 0 0 0 0 No  Number falls in past yr: 0 0 0 0   Injury with Fall? 0 0 0 0   Risk for fall due to : Medication side effect No Fall Risks No  Fall Risks No Fall Risks   Follow up Falls prevention discussed;Falls evaluation completed Falls evaluation completed Falls evaluation completed Falls evaluation completed     MEDICARE RISK AT HOME: Medicare Risk at Home Any stairs in or around the home?: Yes If so, are there any without  handrails?: No Home free of loose throw rugs in walkways, pet beds, electrical cords, etc?: Yes Adequate lighting in your home to reduce risk of falls?: Yes Life alert?: No Use of a cane, walker or w/c?: No Grab bars in the bathroom?: No Shower chair or bench in shower?: No Elevated toilet seat or a handicapped toilet?: No  TIMED UP AND GO:  Was the test performed? No    Cognitive Function:        04/17/2023    1:44 PM  6CIT Screen  What Year? 0 points  What month? 0 points  What time? 0 points  Count back from 20 0 points  Months in reverse 0 points  Repeat phrase 0 points  Total Score 0 points    Immunizations Immunization History  Administered Date(s) Administered   Hepatitis A 04/02/2007, 10/29/2007   Hepatitis A, Ped/Adol-2 Dose 04/02/2007, 10/29/2007   Janssen (J&J) SARS-COV-2 Vaccination 01/07/2020   Tdap 04/02/2007, 03/03/2016, 12/15/2018   Zoster Recombinant(Shingrix) 01/29/2017, 04/17/2017    TDAP status: Up to date  Flu Vaccine status: Declined, Education has been provided regarding the importance of this vaccine but patient still declined. Advised may receive this vaccine at local pharmacy or Health Dept. Aware to provide a copy of the vaccination record if obtained from local pharmacy or Health Dept. Verbalized acceptance and understanding.  Pneumococcal vaccine status: Declined,  Education has been provided regarding the importance of this vaccine but patient still declined. Advised may receive this vaccine at local pharmacy or Health Dept. Aware to provide a copy of the vaccination record if obtained from local pharmacy or Health Dept. Verbalized acceptance and understanding.   Covid-19 vaccine status: Declined, Education has been provided regarding the importance of this vaccine but patient still declined. Advised may receive this vaccine at local pharmacy or Health Dept.or vaccine clinic. Aware to provide a copy of the vaccination record if obtained from  local pharmacy or Health Dept. Verbalized acceptance and understanding.  Qualifies for Shingles Vaccine? Yes   Zostavax completed No   Shingrix Completed?: No.    Education has been provided regarding the importance of this vaccine. Patient has been advised to call insurance company to determine out of pocket expense if they have not yet received this vaccine. Advised may also receive vaccine at local pharmacy or Health Dept. Verbalized acceptance and understanding.  Screening Tests Health Maintenance  Topic Date Due   Pneumonia Vaccine 56+ Years old (1 of 1 - PCV) Never done   COVID-19 Vaccine (2 - Janssen risk series) 02/04/2020   INFLUENZA VACCINE  06/04/2023 (Originally 10/05/2022)   Fecal DNA (Cologuard)  05/26/2023   Medicare Annual Wellness (AWV)  04/16/2024   DTaP/Tdap/Td (4 - Td or Tdap) 12/14/2028   Hepatitis C Screening  Completed   Zoster Vaccines- Shingrix  Completed   HPV VACCINES  Aged Out    Health Maintenance  Health Maintenance Due  Topic Date Due   Pneumonia Vaccine 58+ Years old (1 of 1 - PCV) Never done   COVID-19 Vaccine (2 - Janssen risk series) 02/04/2020    Colorectal cancer screening: Type of screening: Cologuard. Completed 05/25/2020. Repeat every 3 years  Lung Cancer Screening: (Low Dose CT  Chest recommended if Age 58-80 years, 20 pack-year currently smoking OR have quit w/in 15years.) does not qualify.   Lung Cancer Screening Referral: no  Additional Screening:  Hepatitis C Screening: does qualify; Completed 01/29/2017  Vision Screening: Recommended annual ophthalmology exams for early detection of glaucoma and other disorders of the eye. Is the patient up to date with their annual eye exam?  Yes  Who is the provider or what is the name of the office in which the patient attends annual eye exams? [facility] If pt is not established with a provider, would they like to be referred to a provider to establish care? No .   Dental Screening: Recommended  annual dental exams for proper oral hygiene  Diabetic Foot Exam: n/a  Community Resource Referral / Chronic Care Management: CRR required this visit?  No   CCM required this visit?  No    Plan:     I have personally reviewed and noted the following in the patient's chart:   Medical and social history Use of alcohol, tobacco or illicit drugs  Current medications and supplements including opioid prescriptions. Patient is currently taking opioid prescriptions. Information provided to patient regarding non-opioid alternatives. Patient advised to discuss non-opioid treatment plan with their provider. Functional ability and status Nutritional status Physical activity Advanced directives List of other physicians Hospitalizations, surgeries, and ER visits in previous 12 months Vitals Screenings to include cognitive, depression, and falls Referrals and appointments  In addition, I have reviewed and discussed with patient certain preventive protocols, quality metrics, and best practice recommendations. A written personalized care plan for preventive services as well as general preventive health recommendations were provided to patient.     Barb Merino, LPN   [id number]/[id number]   After Visit Summary: (Pick Up) Due to this being a telephonic visit, with patients personalized plan was offered to patient and patient has requested to Pick up at office.  Nurse Notes: none

## 2023-04-23 ENCOUNTER — Encounter: Payer: Self-pay | Admitting: Family Medicine

## 2023-04-23 ENCOUNTER — Ambulatory Visit (INDEPENDENT_AMBULATORY_CARE_PROVIDER_SITE_OTHER): Payer: PPO | Admitting: Family Medicine

## 2023-04-23 VITALS — BP 144/88 | HR 66 | Ht 73.0 in | Wt 160.8 lb

## 2023-04-23 DIAGNOSIS — Z96652 Presence of left artificial knee joint: Secondary | ICD-10-CM

## 2023-04-23 DIAGNOSIS — M1990 Unspecified osteoarthritis, unspecified site: Secondary | ICD-10-CM

## 2023-04-23 DIAGNOSIS — M545 Low back pain, unspecified: Secondary | ICD-10-CM | POA: Diagnosis not present

## 2023-04-23 DIAGNOSIS — M62562 Muscle wasting and atrophy, not elsewhere classified, left lower leg: Secondary | ICD-10-CM

## 2023-04-23 DIAGNOSIS — G8929 Other chronic pain: Secondary | ICD-10-CM

## 2023-04-23 DIAGNOSIS — Z1211 Encounter for screening for malignant neoplasm of colon: Secondary | ICD-10-CM | POA: Diagnosis not present

## 2023-04-23 DIAGNOSIS — F902 Attention-deficit hyperactivity disorder, combined type: Secondary | ICD-10-CM | POA: Diagnosis not present

## 2023-04-23 DIAGNOSIS — Z13220 Encounter for screening for lipoid disorders: Secondary | ICD-10-CM

## 2023-04-23 DIAGNOSIS — Z0000 Encounter for general adult medical examination without abnormal findings: Secondary | ICD-10-CM | POA: Diagnosis not present

## 2023-04-23 DIAGNOSIS — M5416 Radiculopathy, lumbar region: Secondary | ICD-10-CM

## 2023-04-23 DIAGNOSIS — Z2821 Immunization not carried out because of patient refusal: Secondary | ICD-10-CM | POA: Diagnosis not present

## 2023-04-23 LAB — COMPREHENSIVE METABOLIC PANEL
ALT: 11 [IU]/L (ref 0–44)
AST: 21 [IU]/L (ref 0–40)
Albumin: 4.1 g/dL (ref 3.9–4.9)
Alkaline Phosphatase: 82 [IU]/L (ref 44–121)
BUN/Creatinine Ratio: 22 (ref 10–24)
BUN: 17 mg/dL (ref 8–27)
Bilirubin Total: 0.6 mg/dL (ref 0.0–1.2)
CO2: 23 mmol/L (ref 20–29)
Calcium: 9.5 mg/dL (ref 8.6–10.2)
Chloride: 103 mmol/L (ref 96–106)
Creatinine, Ser: 0.77 mg/dL (ref 0.76–1.27)
Globulin, Total: 2.3 g/dL (ref 1.5–4.5)
Glucose: 99 mg/dL (ref 70–99)
Potassium: 4.2 mmol/L (ref 3.5–5.2)
Sodium: 140 mmol/L (ref 134–144)
Total Protein: 6.4 g/dL (ref 6.0–8.5)
eGFR: 98 mL/min/{1.73_m2} (ref >59)

## 2023-04-23 LAB — CBC WITH DIFFERENTIAL/PLATELET
Basophils Absolute: 0.1 10*3/uL (ref 0.0–0.2)
Basos: 2 %
EOS (ABSOLUTE): 0.2 10*3/uL (ref 0.0–0.4)
Eos: 5 %
Hematocrit: 41.5 % (ref 37.5–51.0)
Hemoglobin: 13.2 g/dL (ref 13.0–17.7)
Immature Grans (Abs): 0 10*3/uL (ref 0.0–0.1)
Immature Granulocytes: 0 %
Lymphocytes Absolute: 1.3 10*3/uL (ref 0.7–3.1)
Lymphs: 34 %
MCH: 27.8 pg (ref 26.6–33.0)
MCHC: 31.8 g/dL (ref 31.5–35.7)
MCV: 88 fL (ref 79–97)
Monocytes Absolute: 0.5 10*3/uL (ref 0.1–0.9)
Monocytes: 12 %
Neutrophils Absolute: 1.8 10*3/uL (ref 1.4–7.0)
Neutrophils: 47 %
Platelets: 244 10*3/uL (ref 150–450)
RBC: 4.74 x10E6/uL (ref 4.14–5.80)
RDW: 13.9 % (ref 11.6–15.4)
WBC: 3.8 10*3/uL (ref 3.4–10.8)

## 2023-04-23 LAB — LIPID PANEL
Chol/HDL Ratio: 3.1 {ratio} (ref 0.0–5.0)
Cholesterol, Total: 223 mg/dL — ABNORMAL HIGH (ref 100–199)
HDL: 71 mg/dL (ref >39)
LDL Chol Calc (NIH): 121 mg/dL — ABNORMAL HIGH (ref 0–99)
Triglycerides: 178 mg/dL — ABNORMAL HIGH (ref 0–149)
VLDL Cholesterol Cal: 31 mg/dL (ref 5–40)

## 2023-04-23 NOTE — Progress Notes (Signed)
       Subjective:    Patient ID: Nicholas [name], male    DOB: [birth year], 68 y.o.   MRN: [id number]  HPI He is here for complete examination.  He continues to deal with low back pain and is being followed by Dr. Ethelene Hal.  He uses oxycodone and is also using trazodone to help with sleep.  He has had a left TKR and is now on gabapentin to help with some neuropathic pain associated with that.  He does have underlying ADD and is using Adderall getting 6 hours of benefit out of it.Marland Kitchen  He does occasionally use a cannabinoid aid to help with his discomfort.  He does not smoke and rarely drinks.  He has had several adverse reactions to immunizations therefore not interested in any immunization of any kind.  He continues to work and plans to do this until he is around 53.  Otherwise he has no particular concerns or complaints.   Review of Systems  All other systems reviewed and are negative. Family and social history as well as health maintenance and immunizations was reviewed The 10-year ASCVD risk score (Arnett DK, et al., 2019) is: 17.1%   Values used to calculate the score:     Age: 68 years     Sex: Male     Is Non-Hispanic African American: No     Diabetic: No     Tobacco smoker: No     Systolic Blood Pressure: 144 mmHg     Is BP treated: No     HDL Cholesterol: 71 mg/dL     Total Cholesterol: 223 mg/dL     Objective:    Physical Exam  Alert and in no distress. Tympanic membranes and canals are normal. Pharyngeal area is normal. Neck is supple without adenopathy or thyromegaly. Cardiac exam shows a regular sinus rhythm without murmurs or gallops. Lungs are clear to auscultation.  Left calf atrophy is noted.       Assessment & Plan:  Routine general medical examination at a health care facility  Attention deficit hyperactivity disorder (ADHD), combined type  Arthritis  Chronic midline low back pain without sciatica  Left calf atrophy  Lumbar radiculopathy  History of total  left knee replacement (TKR)  Screening for lipid disorders  Immunization refused Continue on present medication regimen from Dr. Ethelene Hal and from me.

## 2023-04-24 ENCOUNTER — Encounter: Payer: Self-pay | Admitting: Family Medicine

## 2023-04-27 ENCOUNTER — Telehealth: Payer: Self-pay

## 2023-04-27 DIAGNOSIS — E782 Mixed hyperlipidemia: Secondary | ICD-10-CM

## 2023-04-27 MED ORDER — ROSUVASTATIN CALCIUM 20 MG PO TABS: 20.0000 mg | ORAL_TABLET | Freq: Every day | ORAL | 3 refills | Status: DC

## 2023-04-27 NOTE — Telephone Encounter (Signed)
 Called pt, no answer, left voicemail.

## 2023-04-27 NOTE — Telephone Encounter (Signed)
Pt states he does not mind trying a statin. Wants to know if it is something he will be on forever. Also wants to know is there something he can do like diet to help his values. Please advise.

## 2023-04-27 NOTE — Telephone Encounter (Signed)
Called pt, no answer, left voicemail. Sending MyChart message.

## 2023-04-27 NOTE — Telephone Encounter (Signed)
Copied from CRM [id number]. Topic: Clinical - Lab/Test Results >> Apr 27, 2023 11:06 AM Nicholas [name] T wrote: Reason for CRM: patient called stated he read his results but needs the physician to give more info about his results

## 2023-04-27 NOTE — Telephone Encounter (Signed)
Pt called back.  Pt transferred to the office.

## 2023-05-01 ENCOUNTER — Encounter: Payer: Self-pay | Admitting: Internal Medicine

## 2023-05-10 ENCOUNTER — Ambulatory Visit (INDEPENDENT_AMBULATORY_CARE_PROVIDER_SITE_OTHER): Admitting: Family Medicine

## 2023-05-10 ENCOUNTER — Encounter: Payer: Self-pay | Admitting: Family Medicine

## 2023-05-10 VITALS — BP 120/70 | HR 84 | Wt 153.0 lb

## 2023-05-10 DIAGNOSIS — R1030 Lower abdominal pain, unspecified: Secondary | ICD-10-CM | POA: Diagnosis not present

## 2023-05-10 DIAGNOSIS — J069 Acute upper respiratory infection, unspecified: Secondary | ICD-10-CM | POA: Diagnosis not present

## 2023-05-10 NOTE — Progress Notes (Signed)
   Subjective:    Patient ID: Nicholas [name], male    DOB: [birth year], 68 y.o.   MRN: [id number]  HPI Last Friday he developed sinus congestion, headache and coughing.  The cough became productive however within the last day he says that he is at least 50% better.  He also has had some difficulty with lower abdominal discomfort and some loose stools but no nausea, vomiting or true diarrhea.   Review of Systems     Objective:    Physical Exam Alert and in no distress. Tympanic membranes and canals are normal. Pharyngeal area is normal. Neck is supple without adenopathy or thyromegaly. Cardiac exam shows a regular sinus rhythm without murmurs or gallops. Lungs are clear to auscultation. Lower abdominal exam shows some slight discomfort over the entire lower abdominal area but no rebound.  Bowel sounds appeared normal.       Assessment & Plan:  Viral URI with cough  Lower abdominal pain I explained that he is probably over the cough but if he runs into trouble with worsening of his symptoms he is to call for an antibiotic. I then discussed the lower abdominal pain stating that if this gets worse we might need to do further evaluation and probably have an go to the emergency room for further x-rays.

## 2023-05-14 ENCOUNTER — Telehealth: Payer: Self-pay | Admitting: Family Medicine

## 2023-05-14 NOTE — Telephone Encounter (Signed)
 Called [phone] .  Pt Cologuard was not denied, it was too early.  They only ship out q 3 years.  His last one was 05/15/2023.  It is set to autoship tomorrow.  Called pt reached voice mail it was full, will send my chart message.

## 2023-05-26 DIAGNOSIS — Z1211 Encounter for screening for malignant neoplasm of colon: Secondary | ICD-10-CM | POA: Diagnosis not present

## 2023-05-31 LAB — COLOGUARD: COLOGUARD: NEGATIVE

## 2023-06-23 ENCOUNTER — Other Ambulatory Visit: Payer: Self-pay | Admitting: Family Medicine

## 2023-06-23 DIAGNOSIS — M1990 Unspecified osteoarthritis, unspecified site: Secondary | ICD-10-CM

## 2023-06-23 DIAGNOSIS — M545 Low back pain, unspecified: Secondary | ICD-10-CM

## 2023-06-26 DIAGNOSIS — Z5181 Encounter for therapeutic drug level monitoring: Secondary | ICD-10-CM | POA: Diagnosis not present

## 2023-06-26 DIAGNOSIS — Z79899 Other long term (current) drug therapy: Secondary | ICD-10-CM | POA: Diagnosis not present

## 2023-06-26 DIAGNOSIS — G8929 Other chronic pain: Secondary | ICD-10-CM | POA: Diagnosis not present

## 2023-06-26 DIAGNOSIS — M47896 Other spondylosis, lumbar region: Secondary | ICD-10-CM | POA: Diagnosis not present

## 2023-07-31 ENCOUNTER — Other Ambulatory Visit (HOSPITAL_BASED_OUTPATIENT_CLINIC_OR_DEPARTMENT_OTHER): Payer: Self-pay

## 2023-10-09 DIAGNOSIS — M5416 Radiculopathy, lumbar region: Secondary | ICD-10-CM | POA: Diagnosis not present

## 2023-10-09 DIAGNOSIS — M47816 Spondylosis without myelopathy or radiculopathy, lumbar region: Secondary | ICD-10-CM | POA: Diagnosis not present

## 2023-10-09 DIAGNOSIS — M47896 Other spondylosis, lumbar region: Secondary | ICD-10-CM | POA: Diagnosis not present

## 2023-10-09 DIAGNOSIS — Z5181 Encounter for therapeutic drug level monitoring: Secondary | ICD-10-CM | POA: Diagnosis not present

## 2023-10-09 DIAGNOSIS — M961 Postlaminectomy syndrome, not elsewhere classified: Secondary | ICD-10-CM | POA: Diagnosis not present

## 2023-12-19 ENCOUNTER — Other Ambulatory Visit: Payer: Self-pay | Admitting: Family Medicine

## 2023-12-19 DIAGNOSIS — M1990 Unspecified osteoarthritis, unspecified site: Secondary | ICD-10-CM

## 2023-12-19 DIAGNOSIS — M545 Low back pain, unspecified: Secondary | ICD-10-CM

## 2024-01-24 ENCOUNTER — Encounter: Payer: Self-pay | Admitting: Family Medicine

## 2024-01-24 ENCOUNTER — Ambulatory Visit (INDEPENDENT_AMBULATORY_CARE_PROVIDER_SITE_OTHER): Admitting: Family Medicine

## 2024-01-24 VITALS — BP 110/70 | HR 67 | Wt 162.2 lb

## 2024-01-24 DIAGNOSIS — M5416 Radiculopathy, lumbar region: Secondary | ICD-10-CM

## 2024-01-24 DIAGNOSIS — F988 Other specified behavioral and emotional disorders with onset usually occurring in childhood and adolescence: Secondary | ICD-10-CM

## 2024-01-24 DIAGNOSIS — M62562 Muscle wasting and atrophy, not elsewhere classified, left lower leg: Secondary | ICD-10-CM | POA: Diagnosis not present

## 2024-01-24 DIAGNOSIS — Z96652 Presence of left artificial knee joint: Secondary | ICD-10-CM | POA: Diagnosis not present

## 2024-01-24 DIAGNOSIS — M1990 Unspecified osteoarthritis, unspecified site: Secondary | ICD-10-CM | POA: Diagnosis not present

## 2024-01-24 DIAGNOSIS — E782 Mixed hyperlipidemia: Secondary | ICD-10-CM | POA: Diagnosis not present

## 2024-01-24 DIAGNOSIS — Z2821 Immunization not carried out because of patient refusal: Secondary | ICD-10-CM

## 2024-01-24 DIAGNOSIS — F902 Attention-deficit hyperactivity disorder, combined type: Secondary | ICD-10-CM

## 2024-01-24 DIAGNOSIS — E739 Lactose intolerance, unspecified: Secondary | ICD-10-CM

## 2024-01-24 MED ORDER — AMPHETAMINE-DEXTROAMPHETAMINE 10 MG PO TABS: 10.0000 mg | ORAL_TABLET | Freq: Every day | ORAL | 0 refills | Status: DC

## 2024-01-24 MED ORDER — AMPHETAMINE-DEXTROAMPHET ER 30 MG PO CP24: 30.0000 mg | ORAL_CAPSULE | ORAL | 0 refills | Status: DC

## 2024-01-24 MED ORDER — DICLOFENAC SODIUM 75 MG PO TBEC: 75.0000 mg | DELAYED_RELEASE_TABLET | Freq: Two times a day (BID) | ORAL | 1 refills | Status: AC

## 2024-01-24 NOTE — Progress Notes (Signed)
   Subjective:    Patient ID: Nicholas [name], male    DOB: 1955-04-26, 68 y.o.   MRN: [id number]  Discussed the use of AI scribe software for clinical note transcription with the patient, who gave verbal consent to proceed.  History of Present Illness   Nicholas [name] is a 68 year old male who presents for medication management.  He is currently taking time-released Adderall 30 mg once daily for ADHD. However, he experiences difficulty maintaining focus after eight hours during his 14-hour workdays. He has not tried a booster dose but is open to the idea.  He previously ruptured his bicep tendon while swatting a spider, resulting in a 'Popeye arm'.  He takes diclofenac  75 mg twice daily as needed for pain, which he finds effective without causing gastric issues. He has a history of adverse reactions to Aleve and ibuprofen, which cause significant stomach irritation.  He has a history of back surgery, which alleviated some pain but did not fully resolve his symptoms. He experiences intermittent stabbing electric shocks post-knee surgery, for which he takes gabapentin  as needed. He reports it is effective despite not taking it regularly.  He has significant adverse reactions to vaccines, including flu and COVID vaccines, which cause high fevers and illness. He tolerates tetanus shots without issue but is hesitant to receive other vaccines due to past experiences.  He has experienced a gradual weight loss from 185 pounds to 162 pounds over several years. No gastrointestinal symptoms such as nausea, vomiting, or diarrhea.  He avoids dairy due to lactose intolerance, which causes gastrointestinal distress. He manages this by avoiding dairy products and is knowledgeable about food contents to prevent accidental ingestion.           Review of Systems     Objective:    Physical Exam Alert and in no distress. Tympanic membranes and canals are normal. Pharyngeal area is normal. Neck  is supple without adenopathy or thyromegaly. Cardiac exam shows a regular sinus rhythm without murmurs or gallops. Lungs are clear to auscultation.               Assessment & Plan:     Attention-deficit hyperactivity disorder (ADHD) ADHD managed with Adderall XR 30 mg, effective for 8 hours, with focus issues post 8 hours. - Prescribed Adderall XR 30 mg once daily. - Prescribed Adderall 10 mg as needed in the afternoon. - He will keep me informed as to the efficacy of this regimen. Lumbar radiculopathy Intermittent neuropathic pain managed with as-needed gabapentin , effective when used. - Continue gabapentin  as needed.  Mixed hyperlipidemia Discontinued rosuvastatin  due to adverse effects. Cardiovascular risk factors present, but he prefers to avoid medication. - Discussed potential trial of atorvastatin for 30 days to assess tolerance. - His risk factor is at 11%. Immunization not carried out because of patient refusal Refuses vaccines due to adverse reactions, including high fever and systemic symptoms. - Documented refusal of vaccines due to adverse reactions.  General Health Maintenance Colon cancer screening with Cologuard negative. No additional screenings needed based on recent results.     I discussed the weight loss with him and since it is over an extended period of time, this eliminates a lot of red flags.  At this point I do not think it is appropriate to be doing the weight loss evaluation since has been going on over several years.

## 2024-02-05 DIAGNOSIS — L812 Freckles: Secondary | ICD-10-CM | POA: Diagnosis not present

## 2024-02-05 DIAGNOSIS — L821 Other seborrheic keratosis: Secondary | ICD-10-CM | POA: Diagnosis not present

## 2024-02-05 DIAGNOSIS — L820 Inflamed seborrheic keratosis: Secondary | ICD-10-CM | POA: Diagnosis not present

## 2024-02-05 DIAGNOSIS — L309 Dermatitis, unspecified: Secondary | ICD-10-CM | POA: Diagnosis not present

## 2024-02-05 DIAGNOSIS — L570 Actinic keratosis: Secondary | ICD-10-CM | POA: Diagnosis not present

## 2024-02-11 DIAGNOSIS — M47896 Other spondylosis, lumbar region: Secondary | ICD-10-CM | POA: Diagnosis not present

## 2024-02-11 DIAGNOSIS — Z5181 Encounter for therapeutic drug level monitoring: Secondary | ICD-10-CM | POA: Diagnosis not present

## 2024-04-10 ENCOUNTER — Ambulatory Visit: Admitting: Family Medicine

## 2024-04-10 ENCOUNTER — Encounter: Payer: Self-pay | Admitting: Family Medicine

## 2024-04-10 ENCOUNTER — Other Ambulatory Visit: Payer: Self-pay | Admitting: Family Medicine

## 2024-04-10 VITALS — BP 144/80 | HR 80 | Ht 71.5 in | Wt 166.2 lb

## 2024-04-10 DIAGNOSIS — E782 Mixed hyperlipidemia: Secondary | ICD-10-CM | POA: Diagnosis not present

## 2024-04-10 DIAGNOSIS — F902 Attention-deficit hyperactivity disorder, combined type: Secondary | ICD-10-CM

## 2024-04-10 DIAGNOSIS — M1990 Unspecified osteoarthritis, unspecified site: Secondary | ICD-10-CM | POA: Diagnosis not present

## 2024-04-10 DIAGNOSIS — M62562 Muscle wasting and atrophy, not elsewhere classified, left lower leg: Secondary | ICD-10-CM | POA: Diagnosis not present

## 2024-04-10 DIAGNOSIS — Z0000 Encounter for general adult medical examination without abnormal findings: Secondary | ICD-10-CM | POA: Diagnosis not present

## 2024-04-10 DIAGNOSIS — Z23 Encounter for immunization: Secondary | ICD-10-CM

## 2024-04-10 DIAGNOSIS — Z96652 Presence of left artificial knee joint: Secondary | ICD-10-CM | POA: Diagnosis not present

## 2024-04-10 DIAGNOSIS — G8929 Other chronic pain: Secondary | ICD-10-CM | POA: Diagnosis not present

## 2024-04-10 DIAGNOSIS — E739 Lactose intolerance, unspecified: Secondary | ICD-10-CM

## 2024-04-10 DIAGNOSIS — M545 Low back pain, unspecified: Secondary | ICD-10-CM

## 2024-04-10 DIAGNOSIS — Z2821 Immunization not carried out because of patient refusal: Secondary | ICD-10-CM | POA: Diagnosis not present

## 2024-04-10 LAB — CBC WITH DIFFERENTIAL/PLATELET
Basophils Absolute: 0.1 10*3/uL (ref 0.0–0.2)
Basos: 2 %
EOS (ABSOLUTE): 0.2 10*3/uL (ref 0.0–0.4)
Eos: 3 %
Hematocrit: 40.9 % (ref 37.5–51.0)
Hemoglobin: 13.6 g/dL (ref 13.0–17.7)
Immature Grans (Abs): 0 10*3/uL (ref 0.0–0.1)
Immature Granulocytes: 0 %
Lymphocytes Absolute: 1.4 10*3/uL (ref 0.7–3.1)
Lymphs: 23 %
MCH: 30.3 pg (ref 26.6–33.0)
MCHC: 33.3 g/dL (ref 31.5–35.7)
MCV: 91 fL (ref 79–97)
Monocytes Absolute: 0.6 10*3/uL (ref 0.1–0.9)
Monocytes: 11 %
Neutrophils Absolute: 3.6 10*3/uL (ref 1.4–7.0)
Neutrophils: 61 %
Platelets: 236 10*3/uL (ref 150–450)
RBC: 4.49 x10E6/uL (ref 4.14–5.80)
RDW: 12.6 % (ref 11.6–15.4)
WBC: 6 10*3/uL (ref 3.4–10.8)

## 2024-04-10 LAB — LIPID PANEL
Chol/HDL Ratio: 2.1 ratio (ref 0.0–5.0)
Cholesterol, Total: 187 mg/dL (ref 100–199)
HDL: 87 mg/dL
LDL Chol Calc (NIH): 90 mg/dL (ref 0–99)
Triglycerides: 53 mg/dL (ref 0–149)
VLDL Cholesterol Cal: 10 mg/dL (ref 5–40)

## 2024-04-10 LAB — COMPREHENSIVE METABOLIC PANEL WITH GFR
ALT: 12 [IU]/L (ref 0–44)
AST: 24 [IU]/L (ref 0–40)
Albumin: 4.4 g/dL (ref 3.9–4.9)
Alkaline Phosphatase: 79 [IU]/L (ref 47–123)
BUN/Creatinine Ratio: 19 (ref 10–24)
BUN: 18 mg/dL (ref 8–27)
Bilirubin Total: 0.8 mg/dL (ref 0.0–1.2)
CO2: 24 mmol/L (ref 20–29)
Calcium: 8.9 mg/dL (ref 8.6–10.2)
Chloride: 105 mmol/L (ref 96–106)
Creatinine, Ser: 0.94 mg/dL (ref 0.76–1.27)
Globulin, Total: 1.9 g/dL (ref 1.5–4.5)
Glucose: 95 mg/dL (ref 70–99)
Potassium: 4.6 mmol/L (ref 3.5–5.2)
Sodium: 142 mmol/L (ref 134–144)
Total Protein: 6.3 g/dL (ref 6.0–8.5)
eGFR: 88 mL/min/{1.73_m2}

## 2024-04-10 MED ORDER — AMPHETAMINE-DEXTROAMPHETAMINE 10 MG PO TABS: 10.0000 mg | ORAL_TABLET | Freq: Every day | ORAL | 0 refills | Status: DC

## 2024-04-10 MED ORDER — AMPHETAMINE-DEXTROAMPHET ER 30 MG PO CP24: 30.0000 mg | ORAL_CAPSULE | ORAL | 0 refills | Status: DC

## 2024-04-10 MED ORDER — AMPHETAMINE-DEXTROAMPHETAMINE 10 MG PO TABS: 10.0000 mg | ORAL_TABLET | Freq: Every day | ORAL | 0 refills | Status: AC

## 2024-04-10 MED ORDER — AMPHETAMINE-DEXTROAMPHET ER 30 MG PO CP24: 30.0000 mg | ORAL_CAPSULE | ORAL | 0 refills | Status: AC

## 2024-04-10 MED ORDER — AMPHETAMINE-DEXTROAMPHET ER 30 MG PO CP24: 30.0000 mg | ORAL_CAPSULE | Freq: Every day | ORAL | 0 refills | Status: AC

## 2024-04-10 NOTE — Addendum Note (Signed)
 Addended by: JOYCE NORLEEN BROCKS on: 04/10/2024 12:22 PM   Modules accepted: Orders

## 2024-04-10 NOTE — Progress Notes (Addendum)
 Nicholas [name] is a 69 y.o. male who presents for annual wellness visit,CPE and follow-up on chronic medical conditions.  He has the following concerns: Discussed the use of AI scribe software for clinical note transcription with the patient, who gave verbal consent to proceed.   He manages chronic pain that persists despite back surgery. His current regimen includes oxycodone  40 mg daily and Lidoderm  patches as needed. He occasionally uses gabapentin , which effectively alleviates a burning sensation in one spot on his knee, a symptom that developed after knee replacement surgery.  He is on Adderall XR for ADHD, taking 30 mg with a 10 mg booster as needed, primarily when working. He describes a history of hyperactivity and being 'all over the place' since childhood.  He takes trazodone  for sleep, prescribed by his pain management team, at a dose of 100 mg, which he finds helpful. He has a long-standing history of poor sleep.  He has refused certain immunizations, including pneumonia and RSV vaccines, but has received the shingles vaccine. He tests for COVID-19 when feeling unwell, with all tests being negative since 2021. He has had a TKR on the left.   Also has a history of lactose intolerance. He is a social drinker and has never smoked. No concerns about STDs and no recent falls or depression.  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 he describes his work environment as demanding but rewarding.     The 10-year ASCVD risk score (Arnett DK, et al., 2019) is: 15.3%   Values used to calculate the score:     Age: 57 years     Clinically relevant sex: Male     Is Non-Hispanic African American: No     Diabetic: No     Tobacco smoker: No     Systolic Blood Pressure: 144 mmHg     Is BP treated: No     HDL Cholesterol: 87 mg/dL     Total Cholesterol: 187 mg/dL   Immunizations and Health Maintenance Immunization History  Administered Date(s) Administered   Hepatitis A 04/02/2007,  10/29/2007   Hepatitis A, Ped/Adol-2 Dose 04/02/2007, 10/29/2007   Janssen (J&J) SARS-COV-2 Vaccination 01/07/2020   Tdap 04/02/2007, 03/03/2016, 12/15/2018   Zoster Recombinant(Shingrix ) 01/29/2017, 04/17/2017   Health Maintenance Due  Topic Date Due   COVID-19 Vaccine (2 - Janssen risk series) 02/04/2020   Medicare Annual Wellness (AWV)  04/16/2024    Last colonoscopy:2008- cologuard yearly Last PSA: 04/23/20 Dentist: Maryruth Denistry 11/26 Ophtho: Hasn't been in a while Exercise: No  Other doctors caring for patient include: [facility]  Advanced Directives:no . Info given    Depression screen:  See questionnaire below.        04/23/2023    8:36 AM 04/17/2023    1:44 PM 09/13/2021   12:21 PM 04/23/2020    2:08 PM 01/13/2020   12:40 PM  Depression screen PHQ 2/9  Decreased Interest 0 0 0 2 0  Down, Depressed, Hopeless 0 0 3 3 0  PHQ - 2 Score 0 0 3 5 0  Altered sleeping   3 3   Tired, decreased energy   1 0   Change in appetite   0 0   Feeling bad or failure about yourself    1 1   Trouble concentrating   0 2   Moving slowly or fidgety/restless   3 0   Suicidal thoughts   0 0   PHQ-9 Score   11  11  Difficult doing work/chores   Not difficult at all Not difficult at all      Data saved with a previous flowsheet row definition    Fall Screen: See Questionaire below.      01/24/2024    9:40 AM 04/23/2023    8:36 AM 04/17/2023    1:43 PM 06/27/2022    8:42 AM 09/13/2021   12:26 PM  Fall Risk   Falls in the past year? 0 0 0 0 0  Number falls in past yr: 0 0 0 0 0  Injury with Fall? 0  0  0  0  0   Risk for fall due to : No Fall Risks No Fall Risks Medication side effect No Fall Risks No Fall Risks  Follow up Falls evaluation completed Falls evaluation completed Falls prevention discussed;Falls evaluation completed Falls evaluation completed Falls evaluation completed      Data saved with a previous flowsheet row definition    ADL screen:  See questionnaire  below.  Functional Status Survey:     Review of Systems  Constitutional: -, -unexpected weight change, -anorexia, -fatigue Allergy: -sneezing, -itching, -congestion Dermatology: denies changing moles, rash, lumps ENT: -runny nose, -ear pain, -sore throat,  Cardiology:  -chest pain, -palpitations, -orthopnea, Respiratory: -cough, -shortness of breath, -dyspnea on exertion, -wheezing,  Gastroenterology: -abdominal pain, -nausea, -vomiting, -diarrhea, -constipation, -dysphagia Hematology: -bleeding or bruising problems Musculoskeletal: -arthralgias, -myalgias, -joint swelling, -back pain, - Ophthalmology: -vision changes,  Urology: -dysuria, -difficulty urinating,  -urinary frequency, -urgency, incontinence Neurology: -, -numbness, , -memory loss, -falls, -dizziness Left calf atrophy noted.   PHYSICAL EXAM:  Ht 5' 11.5 (1.816 m)   Wt 166 lb 3.2 oz (75.4 kg)   BMI 22.86 kg/m   General Appearance: Alert, cooperative, no distress, appears stated age Head: Normocephalic, without obvious abnormality, atraumatic Eyes: PERRL, conjunctiva/corneas clear, EOM's intact, Ears: Normal TM's and external ear canals Nose: Nares normal, mucosa normal, no drainage or sinus   tenderness Throat: Lips, mucosa, and tongue normal; teeth and gums normal Neck: Supple, no lymphadenopathy, thyroid :no enlargement/tenderness/nodules; no carotid bruit or JVD Lungs: Clear to auscultation bilaterally without wheezes, rales or ronchi; respirations unlabored Heart: Regular rate and rhythm, S1 and S2 normal, no murmur, rub or gallop Abdomen: Soft, non-tender, nondistended, normoactive bowel sounds, no masses, no hepatosplenomegaly Extremities: No clubbing, cyanosis or edema Lymph nodes: Cervical, supraclavicular, and axillary nodes normal Neurologic: CNII-XII intact, normal strength, sensation and gait; reflexes 2+ and symmetric throughout   Psych: Normal mood, affect, hygiene and  grooming  ASSESSMENT/PLAN: Routine general medical examination at a health care facility  Need for vaccination against Streptococcus pneumoniae  Need for COVID-19 vaccine  Mixed hyperlipidemia  Attention deficit hyperactivity disorder (ADHD), combined type  Chronic midline low back pain without sciatica  Arthritis  Left calf atrophy  Immunization refused  History of total left knee replacement (TKR)  Lactose intolerance  Attention deficit disorder, unspecified type   Doing well on his present medication regimen including oxycodone  and use of periodic gabapentin  and Lidoderm  patches.. . Immunization recommendations discussed.  Colonoscopy recommendations reviewed.   Medicare Attestation I have personally reviewed: The patient's medical and social history Their use of alcohol, tobacco or illicit drugs Their current medications and supplements The patient's functional ability including ADLs,fall risks, home safety risks, cognitive, and hearing and visual impairment Diet and physical activities Evidence for depression or mood disorders  The patient's weight, height, and BMI have been recorded in the chart.  I have made referrals, counseling,  and provided education to the patient based on review of the above and I have provided the patient with a written personalized care plan for preventive services.     Norleen Jobs, MD   04/10/2024

## 2024-04-11 ENCOUNTER — Ambulatory Visit: Payer: Self-pay | Admitting: Family Medicine

## 2024-04-11 MED ORDER — ROSUVASTATIN CALCIUM 10 MG PO TABS: 10.0000 mg | ORAL_TABLET | Freq: Every day | ORAL | 3 refills | Status: AC

## 2024-04-11 NOTE — Addendum Note (Signed)
 Addended by: JOYCE NORLEEN BROCKS on: 04/11/2024 04:24 PM   Modules accepted: Orders
